# Patient Record
Sex: Female | Born: 1935 | Race: White | Hispanic: No | Smoking: Never smoker
Health system: Southern US, Community
[De-identification: ages and names within clinical notes are randomized; demographics above are authoritative.]

## PROBLEM LIST (undated history)

## (undated) DIAGNOSIS — I10 Essential (primary) hypertension: Secondary | ICD-10-CM

## (undated) DIAGNOSIS — C801 Malignant (primary) neoplasm, unspecified: Secondary | ICD-10-CM

## (undated) DIAGNOSIS — I82409 Acute embolism and thrombosis of unspecified deep veins of unspecified lower extremity: Secondary | ICD-10-CM

---

## 1998-05-20 ENCOUNTER — Other Ambulatory Visit: Admission: RE | Admit: 1998-05-20 | Discharge: 1998-05-20 | Payer: Self-pay | Admitting: Emergency Medicine

## 1999-08-10 ENCOUNTER — Encounter: Admission: RE | Admit: 1999-08-10 | Discharge: 1999-08-10 | Payer: Self-pay | Admitting: Emergency Medicine

## 1999-08-10 ENCOUNTER — Encounter: Payer: Self-pay | Admitting: Emergency Medicine

## 1999-08-14 HISTORY — PX: MASTECTOMY: SHX3

## 1999-08-16 ENCOUNTER — Encounter: Payer: Self-pay | Admitting: Emergency Medicine

## 1999-08-16 ENCOUNTER — Encounter: Admission: RE | Admit: 1999-08-16 | Discharge: 1999-08-16 | Payer: Self-pay | Admitting: Emergency Medicine

## 1999-08-22 ENCOUNTER — Encounter: Payer: Self-pay | Admitting: General Surgery

## 1999-08-22 ENCOUNTER — Encounter: Admission: RE | Admit: 1999-08-22 | Discharge: 1999-08-22 | Payer: Self-pay | Admitting: General Surgery

## 1999-08-24 ENCOUNTER — Other Ambulatory Visit: Admission: RE | Admit: 1999-08-24 | Discharge: 1999-08-24 | Payer: Self-pay | Admitting: General Surgery

## 1999-08-24 ENCOUNTER — Encounter (INDEPENDENT_AMBULATORY_CARE_PROVIDER_SITE_OTHER): Payer: Self-pay

## 1999-08-24 ENCOUNTER — Encounter: Payer: Self-pay | Admitting: General Surgery

## 1999-08-24 ENCOUNTER — Ambulatory Visit (HOSPITAL_BASED_OUTPATIENT_CLINIC_OR_DEPARTMENT_OTHER): Admission: RE | Admit: 1999-08-24 | Discharge: 1999-08-24 | Payer: Self-pay | Admitting: General Surgery

## 1999-09-08 ENCOUNTER — Encounter: Payer: Self-pay | Admitting: General Surgery

## 1999-09-08 ENCOUNTER — Encounter (INDEPENDENT_AMBULATORY_CARE_PROVIDER_SITE_OTHER): Payer: Self-pay | Admitting: *Deleted

## 1999-09-08 ENCOUNTER — Ambulatory Visit (HOSPITAL_COMMUNITY): Admission: RE | Admit: 1999-09-08 | Discharge: 1999-09-09 | Payer: Self-pay | Admitting: General Surgery

## 1999-11-09 ENCOUNTER — Other Ambulatory Visit: Admission: RE | Admit: 1999-11-09 | Discharge: 1999-11-09 | Payer: Self-pay | Admitting: Emergency Medicine

## 2000-08-19 ENCOUNTER — Encounter: Payer: Self-pay | Admitting: General Surgery

## 2000-08-19 ENCOUNTER — Encounter: Admission: RE | Admit: 2000-08-19 | Discharge: 2000-08-19 | Payer: Self-pay | Admitting: General Surgery

## 2000-11-18 ENCOUNTER — Other Ambulatory Visit: Admission: RE | Admit: 2000-11-18 | Discharge: 2000-11-18 | Payer: Self-pay | Admitting: Emergency Medicine

## 2001-08-22 ENCOUNTER — Encounter: Payer: Self-pay | Admitting: Emergency Medicine

## 2001-08-22 ENCOUNTER — Encounter: Admission: RE | Admit: 2001-08-22 | Discharge: 2001-08-22 | Payer: Self-pay | Admitting: Emergency Medicine

## 2001-09-09 ENCOUNTER — Ambulatory Visit (HOSPITAL_COMMUNITY): Admission: RE | Admit: 2001-09-09 | Discharge: 2001-09-09 | Payer: Self-pay | Admitting: Gastroenterology

## 2001-12-01 ENCOUNTER — Encounter: Payer: Self-pay | Admitting: Gastroenterology

## 2001-12-01 ENCOUNTER — Encounter: Admission: RE | Admit: 2001-12-01 | Discharge: 2001-12-01 | Payer: Self-pay | Admitting: Gastroenterology

## 2002-08-24 ENCOUNTER — Encounter: Admission: RE | Admit: 2002-08-24 | Discharge: 2002-08-24 | Payer: Self-pay | Admitting: Oncology

## 2002-08-24 ENCOUNTER — Encounter: Payer: Self-pay | Admitting: Oncology

## 2003-06-24 ENCOUNTER — Encounter: Admission: RE | Admit: 2003-06-24 | Discharge: 2003-06-24 | Payer: Self-pay | Admitting: Emergency Medicine

## 2003-07-28 ENCOUNTER — Encounter: Admission: RE | Admit: 2003-07-28 | Discharge: 2003-07-28 | Payer: Self-pay | Admitting: Emergency Medicine

## 2003-09-02 ENCOUNTER — Encounter: Admission: RE | Admit: 2003-09-02 | Discharge: 2003-09-02 | Payer: Self-pay | Admitting: Oncology

## 2004-08-17 ENCOUNTER — Ambulatory Visit (HOSPITAL_COMMUNITY): Admission: RE | Admit: 2004-08-17 | Discharge: 2004-08-17 | Payer: Self-pay | Admitting: Gastroenterology

## 2004-09-04 ENCOUNTER — Encounter: Admission: RE | Admit: 2004-09-04 | Discharge: 2004-09-04 | Payer: Self-pay | Admitting: Oncology

## 2004-09-21 ENCOUNTER — Encounter: Admission: RE | Admit: 2004-09-21 | Discharge: 2004-09-21 | Payer: Self-pay | Admitting: Gastroenterology

## 2004-10-06 ENCOUNTER — Ambulatory Visit: Payer: Self-pay | Admitting: Oncology

## 2004-12-01 ENCOUNTER — Ambulatory Visit: Payer: Self-pay | Admitting: Oncology

## 2005-01-29 ENCOUNTER — Ambulatory Visit: Payer: Self-pay | Admitting: Oncology

## 2005-08-29 ENCOUNTER — Ambulatory Visit: Payer: Self-pay | Admitting: Oncology

## 2005-09-05 ENCOUNTER — Encounter: Admission: RE | Admit: 2005-09-05 | Discharge: 2005-09-05 | Payer: Self-pay | Admitting: Oncology

## 2006-05-31 ENCOUNTER — Encounter: Admission: RE | Admit: 2006-05-31 | Discharge: 2006-05-31 | Payer: Self-pay | Admitting: Emergency Medicine

## 2006-06-29 ENCOUNTER — Emergency Department (HOSPITAL_COMMUNITY): Admission: EM | Admit: 2006-06-29 | Discharge: 2006-06-29 | Payer: Self-pay | Admitting: Emergency Medicine

## 2006-08-26 ENCOUNTER — Ambulatory Visit: Payer: Self-pay | Admitting: Oncology

## 2006-08-28 LAB — CBC WITH DIFFERENTIAL/PLATELET
BASO%: 0.4 % (ref 0.0–2.0)
Basophils Absolute: 0 10*3/uL (ref 0.0–0.1)
EOS%: 0.7 % (ref 0.0–7.0)
Eosinophils Absolute: 0.1 10*3/uL (ref 0.0–0.5)
HCT: 32.6 % — ABNORMAL LOW (ref 34.8–46.6)
HGB: 10.7 g/dL — ABNORMAL LOW (ref 11.6–15.9)
LYMPH%: 28.1 % (ref 14.0–48.0)
MCH: 27.1 pg (ref 26.0–34.0)
MCHC: 32.7 g/dL (ref 32.0–36.0)
MCV: 82.8 fL (ref 81.0–101.0)
MONO#: 1 10*3/uL — ABNORMAL HIGH (ref 0.1–0.9)
MONO%: 10.1 % (ref 0.0–13.0)
NEUT#: 5.8 10*3/uL (ref 1.5–6.5)
NEUT%: 60.7 % (ref 39.6–76.8)
Platelets: 348 10*3/uL (ref 145–400)
RBC: 3.94 10*6/uL (ref 3.70–5.32)
RDW: 15.9 % — ABNORMAL HIGH (ref 11.3–14.5)
WBC: 9.6 10*3/uL (ref 3.9–10.0)
lymph#: 2.7 10*3/uL (ref 0.9–3.3)

## 2006-08-28 LAB — COMPREHENSIVE METABOLIC PANEL
ALT: 9 U/L (ref 0–35)
AST: 15 U/L (ref 0–37)
Albumin: 3.5 g/dL (ref 3.5–5.2)
Alkaline Phosphatase: 101 U/L (ref 39–117)
BUN: 27 mg/dL — ABNORMAL HIGH (ref 6–23)
CO2: 25 mEq/L (ref 19–32)
Calcium: 8.5 mg/dL (ref 8.4–10.5)
Chloride: 106 mEq/L (ref 96–112)
Creatinine, Ser: 0.8 mg/dL (ref 0.40–1.20)
Glucose, Bld: 87 mg/dL (ref 70–99)
Potassium: 4.2 mEq/L (ref 3.5–5.3)
Sodium: 140 mEq/L (ref 135–145)
Total Bilirubin: 0.2 mg/dL — ABNORMAL LOW (ref 0.3–1.2)
Total Protein: 7.1 g/dL (ref 6.0–8.3)

## 2006-08-28 LAB — IRON AND TIBC
%SAT: 14 % — ABNORMAL LOW (ref 20–55)
Iron: 39 ug/dL — ABNORMAL LOW (ref 42–145)
TIBC: 277 ug/dL (ref 250–470)
UIBC: 238 ug/dL

## 2006-08-28 LAB — FERRITIN: Ferritin: 76 ng/mL (ref 10–291)

## 2006-08-28 LAB — MORPHOLOGY: PLT EST: ADEQUATE

## 2006-08-28 LAB — LACTATE DEHYDROGENASE: LDH: 110 U/L (ref 94–250)

## 2006-09-09 ENCOUNTER — Encounter: Admission: RE | Admit: 2006-09-09 | Discharge: 2006-09-09 | Payer: Self-pay | Admitting: Oncology

## 2007-09-12 ENCOUNTER — Encounter: Admission: RE | Admit: 2007-09-12 | Discharge: 2007-09-12 | Payer: Self-pay | Admitting: Emergency Medicine

## 2008-09-29 ENCOUNTER — Encounter: Admission: RE | Admit: 2008-09-29 | Discharge: 2008-09-29 | Payer: Self-pay | Admitting: Emergency Medicine

## 2008-11-08 IMAGING — CT CT HEAD W/O CM
1 of 2 series · 13 of 30 positions shown, 17 images · IV contrast (agent unspecified)
Comparison: none

CLINICAL DATA: Near syncope.  Hypertension.
 HEAD CT WITHOUT CONTRAST:
TECHNIQUE: Contiguous axial images were obtained from the base of the skull through the vertex according to standard protocol without contrast.

[Series 2: brain · axial · 0.47mm/px · z∈[+144,+270]mm · 13 of 28 slices shown, 17 images]
[im 2/28  brain]
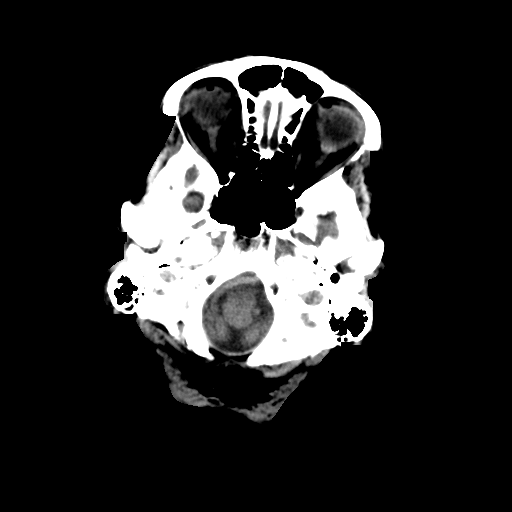
[im 2/28  bone]
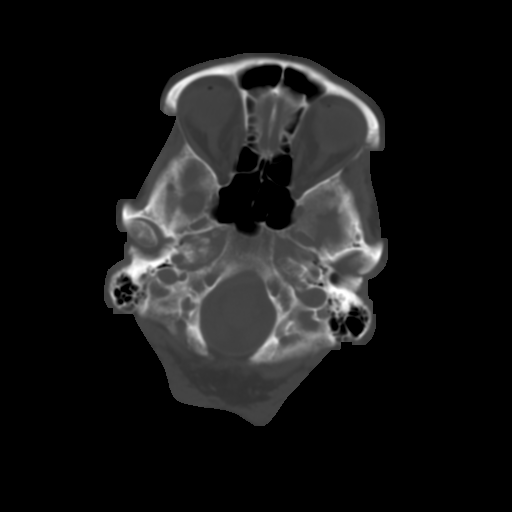
[im 4/28  brain]
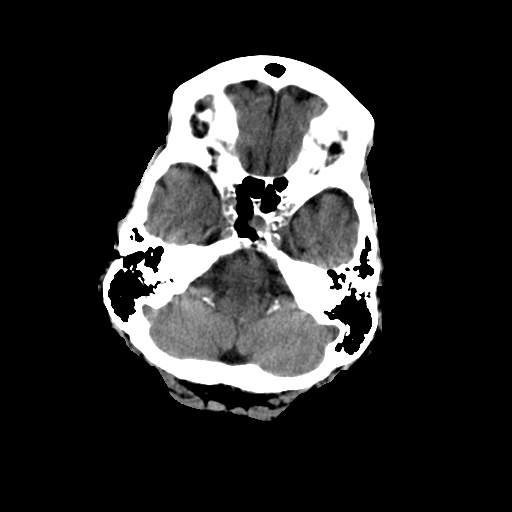
[im 6/28  brain]
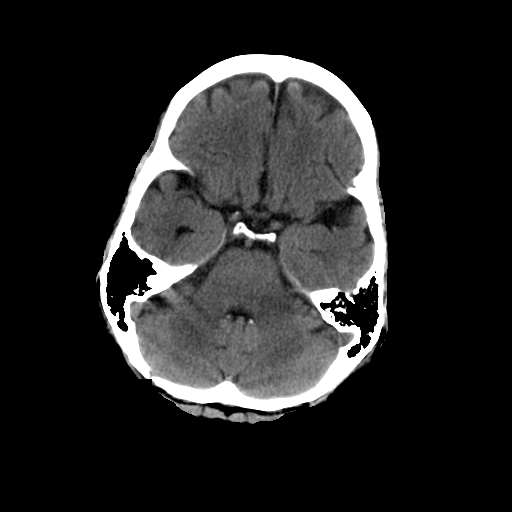
[im 8/28  brain]
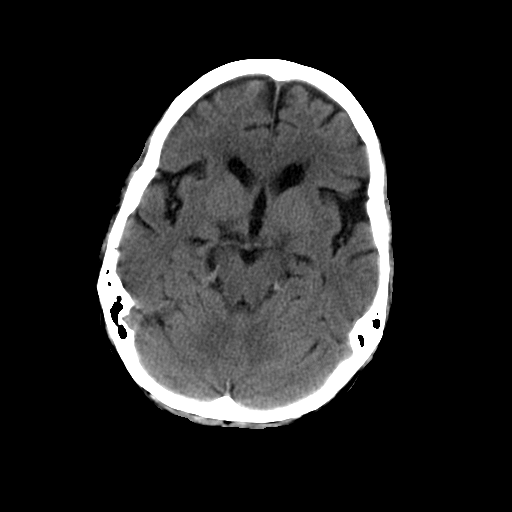
[im 10/28  brain]
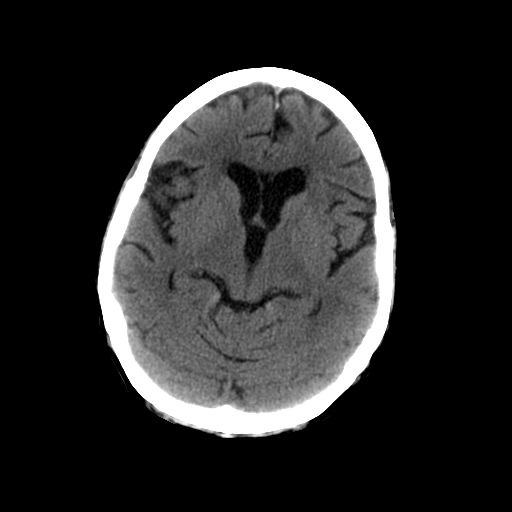
[im 10/28  bone]
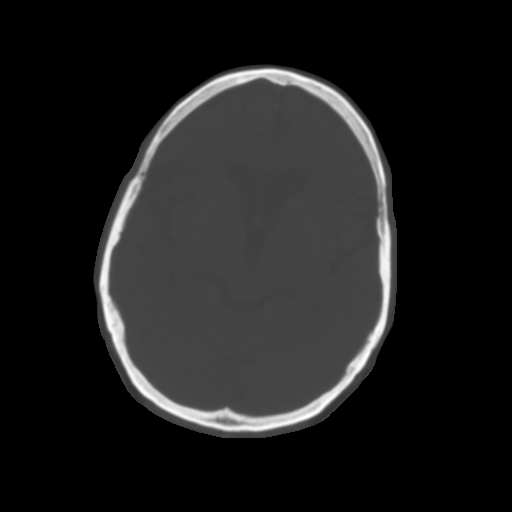
[im 12/28  brain]
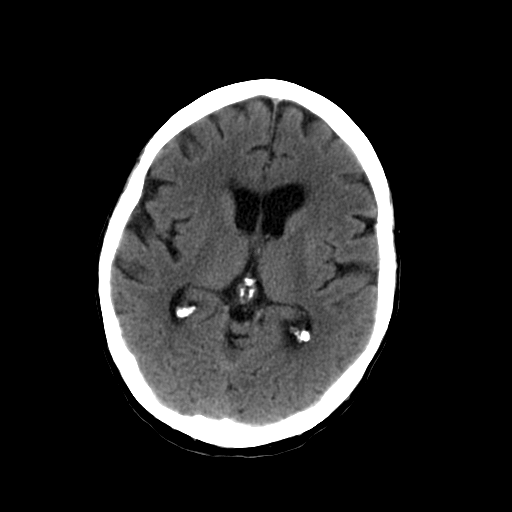
[im 14/28  brain]
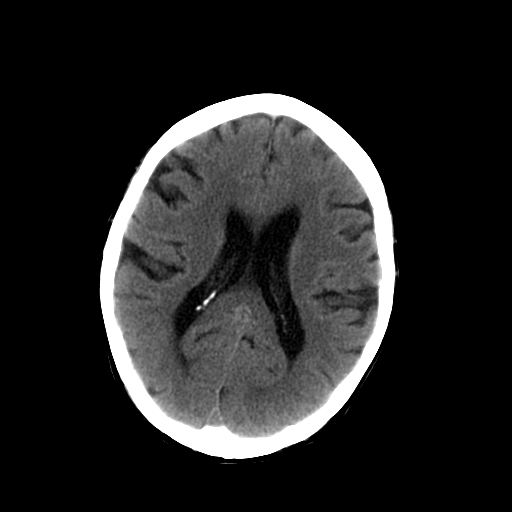
[im 16/28  brain]
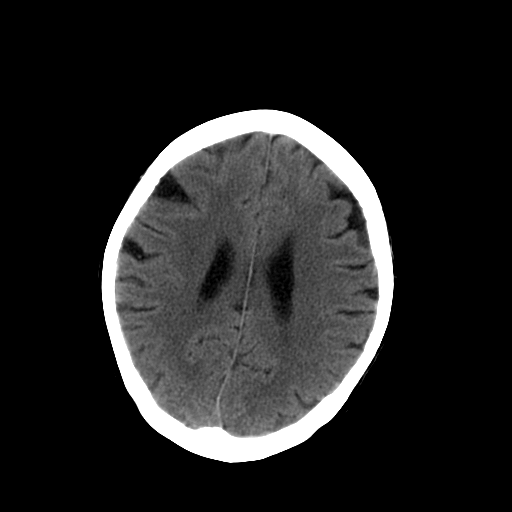
[im 18/28  brain]
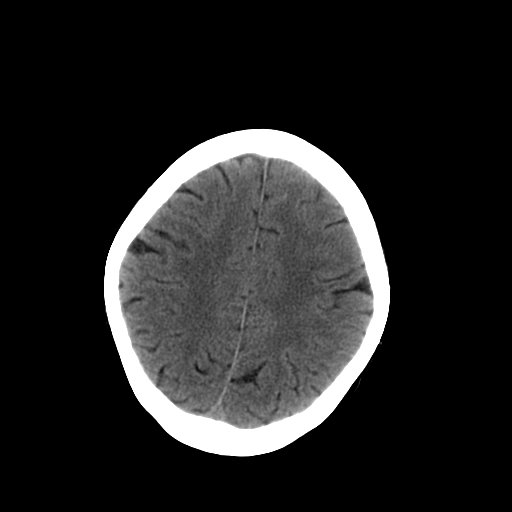
[im 18/28  bone]
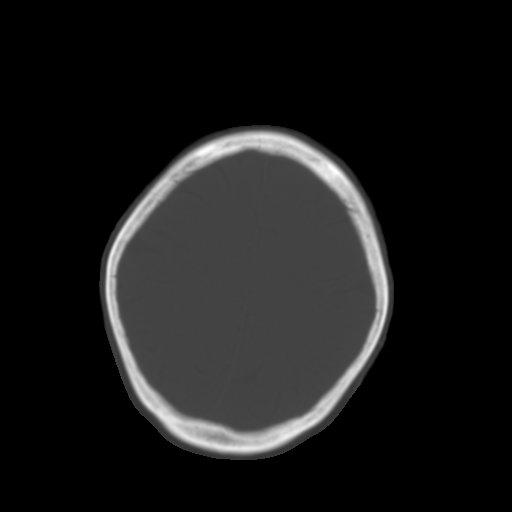
[im 20/28  brain]
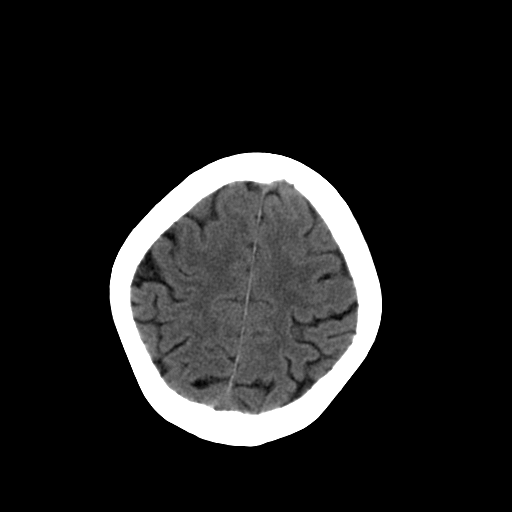
[im 22/28  brain]
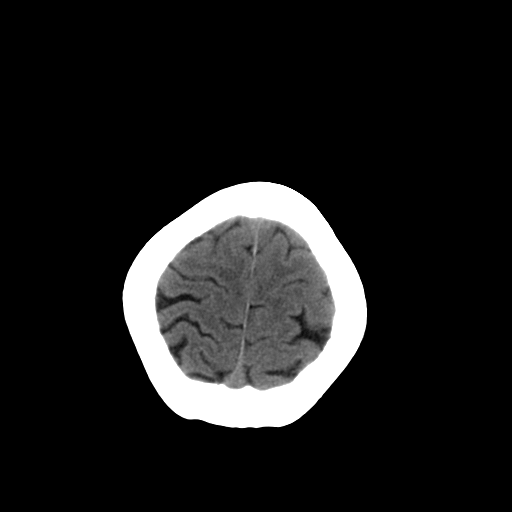
[im 24/28  brain]
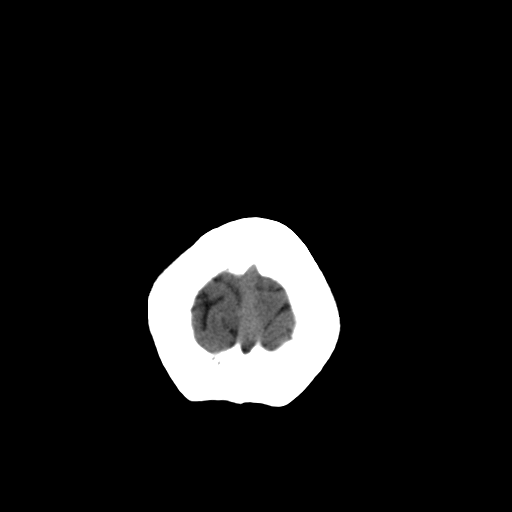
[im 26/28  brain]
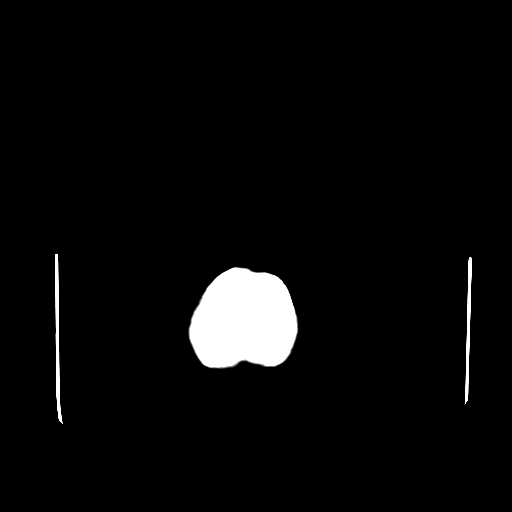
[im 26/28  bone]
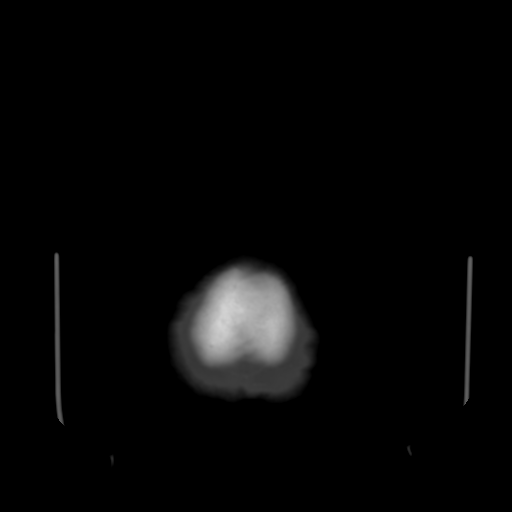

[13 of 30 positions shown; findings below may reference images not displayed]

FINDINGS: There is no evidence of intracranial hemorrhage, brain edema, acute infarct, mass lesion, or mass effect.  No other intra-axial abnormalities are seen, and the ventricles are within normal limits.  No abnormal extra-axial fluid collections or masses are identified.  No skull abnormalities are noted.
IMPRESSION: Negative non-contrast head CT.

## 2009-09-30 ENCOUNTER — Encounter: Admission: RE | Admit: 2009-09-30 | Discharge: 2009-09-30 | Payer: Self-pay | Admitting: Internal Medicine

## 2010-09-02 ENCOUNTER — Other Ambulatory Visit: Payer: Self-pay | Admitting: Internal Medicine

## 2010-09-02 DIAGNOSIS — Z1239 Encounter for other screening for malignant neoplasm of breast: Secondary | ICD-10-CM

## 2010-10-03 ENCOUNTER — Ambulatory Visit
Admission: RE | Admit: 2010-10-03 | Discharge: 2010-10-03 | Disposition: A | Discharge status: Home / Self Care | Payer: Medicare Other | Source: Ambulatory Visit | Attending: Internal Medicine | Admitting: Internal Medicine

## 2010-10-03 DIAGNOSIS — Z1239 Encounter for other screening for malignant neoplasm of breast: Secondary | ICD-10-CM

## 2010-12-29 NOTE — Procedures (Signed)
[place]. [facility]  Patient:    Lacey Morgan, Lacey Morgan Visit Number: [id number] MRN: [id number]          Service Type: END Location: ENDO Attending Physician:  Charna Elizabeth Dictated by:   Anselmo Rod, M.D. Proc. Date: 09/10/01 Admit Date:  09/09/2001   CC:         Reuben Likes, M.D.  Adolph Pollack, M.D.   Procedure Report  DATE OF BIRTH:  1924/02/10.  PROCEDURE:  Colonoscopy.  ENDOSCOPIST:  Anselmo Rod, M.D.  INSTRUMENT USED:  Adjustable pediatric colonoscope.  INDICATION FOR PROCEDURE:  Screening colonoscopy being performed in a 77 year old white female with a personal history of breast cancer and a history of blood in stools.  The patient also has a history of anemia.  PREPROCEDURE PREPARATION:  Informed consent was procured from the patient. The patient was fasted for eight hours prior to the procedure and prepped with a bottle of magnesium citrate and a gallon of NuLytely the night prior to the procedure.  PREPROCEDURE PHYSICAL:  VITAL SIGNS:  The patient had stable vital signs.  NECK:  Supple.  CHEST:  Clear to auscultation.  S1, S2 regular.  ABDOMEN:  Soft with normal bowel sounds.  DESCRIPTION OF PROCEDURE:  The patient was placed in the left lateral decubitus position and sedated with 50 mg of Demerol and 5 mg of Versed intravenously.  Once the patient was adequately sedate and maintained on low-flow oxygen and continuous cardiac monitoring, the Olympus video colonoscope was advanced from the rectum to the cecum without difficulty. The patient had external hemorrhoids and prolapsing large internal hemorrhoids seen on anal inspection.  The rest of the colon up to the cecum appeared healthy and without lesions.  There was no evidence of diverticulosis, masses, or polyps.  IMPRESSION: 1. Large, prolapsing internal hemorrhoids. 2. Small external hemorrhoids. 3. Normal colon up to the cecum.  No masses or polyps  seen.  RECOMMENDATIONS: 1. Repeat colorectal cancer screening is recommended in the next five years. 2. A surgical evaluation has been set up for a formal hemorrhoidectomy with    Dr. Avel Peace in the next couple of weeks. Dictated by:   Anselmo Rod, M.D. Attending Physician:  Charna Elizabeth DD:  09/10/01 TD:  09/10/01 Job: [id number]

## 2010-12-29 NOTE — Op Note (Signed)
[place]. [facility]  Patient:    Lacey Morgan                     MRN: [id number] Proc. Date: 09/08/99 Adm. Date:  [id number] Attending:  Cherylynn Ridges                           Operative Report  PREOPERATIVE DIAGNOSIS: Right breast cancer.  POSTOPERATIVE DIAGNOSIS: Right breast cancer.  PROCEDURE: 1. Right ______ with central node biopsy. 2. Lymphatic mapping with vital blue.  SURGEON: Jimmye Norman, M.D.  ASSISTANT: Donnie Coffin. Samuella Cota, M.D.  ANESTHESIA: General endotracheal.  ESTIMATED BLOOD LOSS: Less than 100 cc.  COMPLICATIONS: None.  CONDITION ON DISCHARGE: Stable.  SPECIMEN: Right breast with right axillary central node from the first level.  FINDINGS: No entrance into the biopsy cavity.  Localized lymph node by vital blue and radioactivity.  INDICATIONS: The patient is a 77 year old woman who developed an interval change on a mammogram.  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  She is now brought to the operating room for a right mastectomy nd a central node biopsy.  DESCRIPTION OF PROCEDURE: The patient was taken to the operating room and placed on the table in supine position.  A towel roll was placed underneath the right shoulder.  She was prepped and draped in the usual sterile manner after adequate endotracheal intubation.  A #10 blade was used to make the superior flap just about 1 cm above the right areolar edge and the biopsy site.  It was taken down anterior and medial to the  anterior axillary line.  The superior breast flap was made using breast hooks or mastectomy hooks and electrocautery.  It was taken down to the chest wall just beneath the right clavicle, medially, just medial to the sternum.  An inferior flap was made in a similar manner initially making a skin incision ith a #10 blade and then  dissecting out a thin flap using electrocautery down to the chest wall just inferior to the pectoralis major muscle.  Subsequently, after we had detached the breast medially and laterally and down to the latissimus dorsi  anteriorly we were able to localize the central lymph nodes using a neoprobe #2. It was easily identified and then subsequently it was also stained with vital blue which had been injected at the beginning of the case prior to prepping.  Once we removed the central node and had high radioactivity counts and also was blue, we sent it for a frozen section.  It demonstrated no evidence of tumor on touch prep. Subsequently, once the lymph node was removed with Hemoclips being applied to the lymphatics we irrigated the site of the mastectomy and axilla with sterile water. We controlled bleeding with electrocautery.  We put two #10 flat Jackson-Pratt drains, one in the axilla and underneath the superior flap.  Once we had confirmation that the sentinel was negative, we closed with only the central node being removed and the breast specimen.  We reapproximated the skin flaps using interrupted 3-0 Vicryl subcuticular stitches and then the skin was closed using a running subcuticular 4-0 Vicryl.  All needle counts, sponge counts and instrument counts were correct.  Steri-Strips were applied to the wound along with a sterile dressing. DD:  09/08/99 TD:  09/09/99 Job: [id number]

## 2010-12-29 NOTE — H&P (Signed)
[place]. [facility]  Patient:    Lacey Morgan, Lacey Morgan                      MRN: [id number] Adm. Date:  08/24/99 Dictator:   Jimmye Norman, M.D. CC:         Reuben Likes, M.D.                         History and Physical  PREOPERATIVE DIAGNOSIS:  Mammographic lesion of the right breast.  IDENTIFICATION/CHIEF COMPLAINT:  The patient is a 77 year old woman, otherwise healthy, with a recent noted change on her mammogram, with now a suspicious lesion in the retro nipple or areolar area of the right breast.  HISTORY OF PRESENT ILLNESS:  I first saw Lacey Morgan in my office on August 22, 1999, at which time she was referred because of a change in her mammogram from December of 1999 to December of 2000.  Irregularities were actually noted on both breast but when she went back in for diagnostic and mag views the one on the left appeared to be a simple cyst while the one on the right continued to have features suggesting malignancy.  The patient is gravida 7, para 6, with one miscarriage.  She has had no cesarean sections and all the births were normal spontaneous vaginal deliveries.  She takes no hormones and never has taken any hormonal therapy.  Her only medical problem is mild hypertension, for which she takes 25 mg of hydrochlorothiazide.  FAMILY HISTORY:  She has no family history of breast cancer.  She does have a daughter who has recently been worked up for a breast mass, but at the current ime no pathology is known.  She started her periods at the age of 76 and went into menopause sometime in her 37s; she cannot be specific as to the age.  Her other  family history is significant for a father who died of heart disease and mother who died of some unknown kind of cancer.  She has three brothers who are living that have high blood pressure and cardiac disease, and two sisters who also have high blood pressure and cardiac disease.  PAST MEDICAL  HISTORY:  Unremarkable except for the hypertension.  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  ALLERGIES:  No known drug allergies.  MEDICATIONS:  She takes only hydrochlorothiazide.  REVIEW OF SYSTEMS:  She has had no nipple changes, discharges, or pain.  She wears glasses.  She has normal hearing.  Wears dentures.  High blood pressure only. he has no pulmonary problems.  She has had problems with hemorrhoids in the past but it currently is inactive.  She has some urinary incontinence by history.  Her last mammogram was in December 2000, which showed the lesion mentioned previously. he has no skin changes.  She does have some back pain and neurologically she is intact.  PHYSICAL EXAMINATION:  GENERAL:  She is well-developed, well-nourished and in no acute distress.  VITAL SIGNS:  Pulse 64, blood pressure 130/70.  She was afebrile.  Respirations  were 16.  HEENT:  Normocephalic, atraumatic.  NECK:  No cervical adenopathy.  No bruits.  CHEST:  Clear.  BREAST:  She has large pendulous breast bilaterally.  She has nipple inversion n the left side, which the patient says is chronic and it has been that way all her life.  She has  no excoriation or rashes on her nipple.  Axillary examination demonstrates no adenopathy bilaterally.  Breast palpable examination:  She has o dominant masses although she does have fibrocystic nodularity throughout both breast with some question of an inferior medial nodularity or palpable lesion on the right side.  There was no expression of any discharge from either nipple.  ABDOMEN/PELVIC/RECTAL:  Deferred.  LABORATORY DATA:  Laboratory studies are pending.  The only radiologic examinations we have currently are the mammograms which have been discussed.  IMPRESSION/PLAN: 1.  New mammographic spiculated lesion on recent mammogram suspicious for cancer.     However, it is not palpable and therefore she  will require wire localization     biopsy.  This will be done as an outpatient at [facility] day surgery as soon as     possible.  She will get some perioperative antibiotic coverage. DD:  08/24/99 TD:  08/24/99 Job: [id number]

## 2010-12-29 NOTE — Op Note (Signed)
[place]. [facility]  Patient:    Lacey Morgan                     MRN: [id number] Proc. Date: 08/24/99 Adm. Date:  [id number] Attending:  Cherylynn Ridges                           Operative Report  PREOPERATIVE DIAGNOSIS:  Abnormal mammogram and suspicious mammographic lesion f the right breast.  POSTOPERATIVE DIAGNOSIS:  Abnormal mammogram and suspicious mammographic lesion of the right breast.  OPERATION PERFORMED:  Wire localization right breast biopsy.  SURGEON:  Jimmye Norman, M.D.  ASSISTANT:  None.  ANESTHESIA:  General with a laryngeal airway and 0.5% Marcaine given postoperatively.  ESTIMATED BLOOD LOSS:  Less than 20 cc.  COMPLICATIONS:  None.  CONDITION:  Stable.  SPECIMENS:  Wire localization biopsy confirmed to have mammographic lesion.  FINDINGS:  No palpable abnormalities, no gross tumor.  DESCRIPTION OF PROCEDURE:  The patient was taken to the operating room and placed on the table in supine position.  After adequate general laryngeal airway anesthetic was administered, she was prepped and draped in the usual sterile manner exposing the right breast.  The patient had had a previous wire localization by radiology and the wire was ut short approximately 2 cm from the skin entrance site.  It coursed medially and somewhat inferiorly towards the nipple from the medial aspect of the right breast. A curvilinear periareolar incision was made after the patient was put asleep and prepped and draped in the usual sterile manner.  We dissected down into the subcutaneous with a #15 blade down to the wire to the tip of the wire, grasping it with an Allis clamp and then dissecting out around the tip of the wire.  We removed the specimen in toto with the wire being brought through its entrance at the skin. We confirmed that it had the mammographic lesion using specimen radiography and  then subsequently we irrigated, found there to  be adequate hemostasis with electrocautery, then closed in two layers with a subcu layer of 4-0 Vicryl and  skin layer of running 5-0 Vicryl.  All sponge, needle and instrument counts were correct.  A sterile dressing was applied to the wound. DD:  08/24/99 TD:  08/24/99 Job: [id number]

## 2011-09-03 ENCOUNTER — Other Ambulatory Visit: Payer: Self-pay | Admitting: Internal Medicine

## 2011-09-03 DIAGNOSIS — Z1231 Encounter for screening mammogram for malignant neoplasm of breast: Secondary | ICD-10-CM

## 2011-10-05 ENCOUNTER — Ambulatory Visit
Admission: RE | Admit: 2011-10-05 | Discharge: 2011-10-05 | Disposition: A | Discharge status: Home / Self Care | Payer: No Typology Code available for payment source | Source: Ambulatory Visit | Attending: Internal Medicine | Admitting: Internal Medicine

## 2011-10-05 DIAGNOSIS — Z1231 Encounter for screening mammogram for malignant neoplasm of breast: Secondary | ICD-10-CM

## 2012-09-15 ENCOUNTER — Other Ambulatory Visit: Payer: Self-pay | Admitting: Internal Medicine

## 2012-09-15 DIAGNOSIS — Z1231 Encounter for screening mammogram for malignant neoplasm of breast: Secondary | ICD-10-CM

## 2012-10-21 ENCOUNTER — Ambulatory Visit
Admission: RE | Admit: 2012-10-21 | Discharge: 2012-10-21 | Disposition: A | Discharge status: Home / Self Care | Payer: Medicare Other | Source: Ambulatory Visit | Attending: Internal Medicine | Admitting: Internal Medicine

## 2013-10-05 ENCOUNTER — Other Ambulatory Visit: Payer: Self-pay | Admitting: Internal Medicine

## 2013-10-05 ENCOUNTER — Other Ambulatory Visit: Payer: Self-pay

## 2013-10-05 DIAGNOSIS — Z1231 Encounter for screening mammogram for malignant neoplasm of breast: Secondary | ICD-10-CM

## 2013-10-23 ENCOUNTER — Ambulatory Visit
Admission: RE | Admit: 2013-10-23 | Discharge: 2013-10-23 | Disposition: A | Discharge status: Home / Self Care | Payer: Medicare Other | Source: Ambulatory Visit

## 2013-10-23 DIAGNOSIS — Z1231 Encounter for screening mammogram for malignant neoplasm of breast: Secondary | ICD-10-CM

## 2014-09-28 ENCOUNTER — Other Ambulatory Visit: Payer: Self-pay

## 2014-09-28 DIAGNOSIS — Z1231 Encounter for screening mammogram for malignant neoplasm of breast: Secondary | ICD-10-CM

## 2014-10-25 ENCOUNTER — Ambulatory Visit
Admission: RE | Admit: 2014-10-25 | Discharge: 2014-10-25 | Disposition: A | Discharge status: Home / Self Care | Payer: Medicare Other | Source: Ambulatory Visit

## 2014-10-25 DIAGNOSIS — Z1231 Encounter for screening mammogram for malignant neoplasm of breast: Secondary | ICD-10-CM

## 2015-06-04 ENCOUNTER — Encounter (HOSPITAL_COMMUNITY): Payer: Self-pay | Admitting: Nurse Practitioner

## 2015-06-04 ENCOUNTER — Emergency Department (HOSPITAL_COMMUNITY)
Admission: EM | Admit: 2015-06-04 | Discharge: 2015-06-04 | Disposition: A | Discharge status: Home / Self Care | Payer: Medicare Other | Attending: Emergency Medicine | Admitting: Emergency Medicine

## 2015-06-04 DIAGNOSIS — I10 Essential (primary) hypertension: Secondary | ICD-10-CM | POA: Insufficient documentation

## 2015-06-04 DIAGNOSIS — Z859 Personal history of malignant neoplasm, unspecified: Secondary | ICD-10-CM | POA: Diagnosis not present

## 2015-06-04 DIAGNOSIS — R600 Localized edema: Secondary | ICD-10-CM | POA: Diagnosis not present

## 2015-06-04 DIAGNOSIS — R5383 Other fatigue: Secondary | ICD-10-CM | POA: Diagnosis not present

## 2015-06-04 DIAGNOSIS — R630 Anorexia: Secondary | ICD-10-CM | POA: Diagnosis not present

## 2015-06-04 DIAGNOSIS — R2240 Localized swelling, mass and lump, unspecified lower limb: Secondary | ICD-10-CM | POA: Diagnosis present

## 2015-06-04 HISTORY — DX: Malignant (primary) neoplasm, unspecified: C80.1

## 2015-06-04 HISTORY — DX: Essential (primary) hypertension: I10

## 2015-06-04 LAB — BRAIN NATRIURETIC PEPTIDE: B Natriuretic Peptide: 210 pg/mL — ABNORMAL HIGH (ref 0.0–100.0)

## 2015-06-04 LAB — CBC
HCT: 34.4 % — ABNORMAL LOW (ref 36.0–46.0)
HEMOGLOBIN: 11.3 g/dL — AB (ref 12.0–15.0)
MCH: 30.3 pg (ref 26.0–34.0)
MCHC: 32.8 g/dL (ref 30.0–36.0)
MCV: 92.2 fL (ref 78.0–100.0)
Platelets: 177 10*3/uL (ref 150–400)
RBC: 3.73 MIL/uL — ABNORMAL LOW (ref 3.87–5.11)
RDW: 18.2 % — AB (ref 11.5–15.5)
WBC: 10 10*3/uL (ref 4.0–10.5)

## 2015-06-04 LAB — BASIC METABOLIC PANEL
Anion gap: 5 (ref 5–15)
BUN: 32 mg/dL — ABNORMAL HIGH (ref 6–20)
CO2: 24 mmol/L (ref 22–32)
Calcium: 8.2 mg/dL — ABNORMAL LOW (ref 8.9–10.3)
Chloride: 110 mmol/L (ref 101–111)
Creatinine, Ser: 1.01 mg/dL — ABNORMAL HIGH (ref 0.44–1.00)
GFR calc Af Amer: 55 mL/min — ABNORMAL LOW (ref >60)
GFR, EST NON AFRICAN AMERICAN: 48 mL/min — AB (ref >60)
Glucose, Bld: 86 mg/dL (ref 65–99)
POTASSIUM: 4.9 mmol/L (ref 3.5–5.1)
SODIUM: 139 mmol/L (ref 135–145)

## 2015-06-04 MED ORDER — FUROSEMIDE 20 MG PO TABS: 20.0000 mg | ORAL_TABLET | Freq: Every day | ORAL | Status: DC

## 2015-06-04 MED ORDER — FUROSEMIDE 10 MG/ML IJ SOLN
40.0000 mg | Freq: Once | INTRAMUSCULAR | Status: AC
  Administered 2015-06-04: 40 mg via INTRAVENOUS
  Filled 2015-06-04: qty 4

## 2015-06-04 NOTE — Discharge Instructions (Signed)
I am giving you a fluid pill called Lasix to try and get the edema off your legs. Please take it once a day in the morning.   Please call your PCP on Monday to have a follow up appointment in the next several days.   Edema Edema is an abnormal buildup of fluids. It is more common in your legs and thighs. Painless swelling of the feet and ankles is more likely as a person ages. It also is common in looser skin, like around your eyes. HOME CARE   Keep the affected body part above the level of the heart while lying down.  Do not sit still or stand for a long time.  Do not put anything right under your knees when you lie down.  Do not wear tight clothes on your upper legs.  Exercise your legs to help the puffiness (swelling) go down.  Wear elastic bandages or support stockings as told by your doctor.  A low-salt diet may help lessen the puffiness.  Only take medicine as told by your doctor. GET HELP IF:  Treatment is not working.  You have heart, liver, or kidney disease and notice that your skin looks puffy or shiny.  You have puffiness in your legs that does not get better when you raise your legs.  You have sudden weight gain for no reason. GET HELP RIGHT AWAY IF:   You have shortness of breath or chest pain.  You cannot breathe when you lie down.  You have pain, redness, or warmth in the areas that are puffy.  You have heart, liver, or kidney disease and get edema all of a sudden.  You have a fever and your symptoms get worse all of a sudden. MAKE SURE YOU:   Understand these instructions.  Will watch your condition.  Will get help right away if you are not doing well or get worse.   This information is not intended to replace advice given to you by your health care provider. Make sure you discuss any questions you have with your health care provider.   Document Released: 01/16/2008 Document Revised: 08/04/2013 Document Reviewed: 05/22/2013 Elsevier Interactive  Patient Education [employer].

## 2015-06-04 NOTE — ED Notes (Signed)
Patient left at this time with all belongings. 

## 2015-06-04 NOTE — ED Notes (Signed)
Pt reports 2-3 day history of BLE pain and swelling, noticed fluid weeping from her legs since yesterday. BLE with pitting edema nad oozing fluid, RLE erythematous. She denies sob, cp. Has been having trouble walking due to the pain

## 2015-06-04 NOTE — ED Provider Notes (Signed)
CSN: [id number]     Arrival date & time 06/04/15  1603 History   First MD Initiated Contact with Patient 06/04/15 1627     Chief Complaint  Patient presents with  . Leg Swelling   (Consider location/radiation/quality/duration/timing/severity/associated sxs/prior Treatment) HPI Comments: Lacey Morgan is an 81 year old female with a past medical history of hypertension who presents with 3 day history of bilateral leg swelling and weeping. She reports that she never has any leg swelling but her daughters at bedside report that they often notice feet and ankle swelling when the visit her but never any leg swelling. She denies any history of heart failure but is currently on metoprolol and lisinopril but denies any diuretics. She reports that the right leg has been red for the past 3 days. Denies any fevers, chills, trauma, shortness of breath, chest pain. Daughters report that she has been complaining of decreased energy and feeling cold over the past 3 days. Decreased appetite during this span as well.   The history is provided by the patient and a relative.    Past Medical History  Diagnosis Date  . Hypertension   . Cancer [facility])    Past Surgical History  Procedure Laterality Date  . Mastectomy     History reviewed. No pertinent family history. Social History  Substance Use Topics  . Smoking status: Never Smoker   . Smokeless tobacco: None  . Alcohol Use: No   OB History    No data available     Review of Systems  Constitutional: Positive for appetite change and fatigue. Negative for fever and chills.  HENT: Negative for facial swelling.   Respiratory: Negative for cough, chest tightness, shortness of breath and wheezing.   Cardiovascular: Positive for leg swelling. Negative for chest pain and palpitations.  Gastrointestinal: Negative for nausea, vomiting, abdominal pain, diarrhea, constipation and abdominal distention.  Endocrine: Positive for cold intolerance.  Genitourinary:  Negative for dysuria and difficulty urinating.  Neurological: Negative for dizziness, light-headedness and headaches.    Allergies  Review of patient's allergies indicates no known allergies.  Home Medications   Prior to Admission medications   Not on File   BP 116/54 mmHg  Pulse 64  Temp(Src) 98.3 F (36.8 C) (Oral)  Resp 20  Ht 5' (1.524 m)  Wt 109 lb (49.442 kg)  BMI 21.29 kg/m2  SpO2 100%   Physical Exam  Constitutional: She is oriented to person, place, and time. Vital signs are normal. She is cooperative. No distress.  HENT:  Head: Normocephalic and atraumatic.  Mouth/Throat: Oropharynx is clear and moist. No oropharyngeal exudate.  Eyes: Conjunctivae and EOM are normal. Pupils are equal, round, and reactive to light.  Neck: Normal range of motion. Neck supple. No JVD present.  Cardiovascular: Normal rate, regular rhythm, normal heart sounds and intact distal pulses.  Exam reveals no gallop and no friction rub.   No murmur heard. Pulmonary/Chest: Effort normal and breath sounds normal. No respiratory distress. She has no decreased breath sounds. She has no wheezes. She has no rhonchi. She has no rales.  Abdominal: Soft. Bowel sounds are normal. She exhibits no distension. There is no tenderness.  Musculoskeletal: Normal range of motion.  Neurological: She is alert and oriented to person, place, and time. No cranial nerve deficit.  Skin: No lesion and no rash noted. She is not diaphoretic. There is erythema.  Bilateral 3+ pitting edema to knees with weeping. Both feet are cold to touch. Right leg with mild  erythema but no open wounds or warmth.   Psychiatric: She has a normal mood and affect.    ED Course  Procedures (including critical care time) Labs Review Labs Reviewed  BASIC METABOLIC PANEL - Abnormal; Notable for the following:    BUN 32 (*)    Creatinine, Ser 1.01 (*)    Calcium 8.2 (*)    GFR calc non Af Amer 48 (*)    GFR calc Af Amer 55 (*)    All  other components within normal limits  CBC - Abnormal; Notable for the following:    RBC 3.73 (*)    Hemoglobin 11.3 (*)    HCT 34.4 (*)    RDW 18.2 (*)    All other components within normal limits  BRAIN NATRIURETIC PEPTIDE - Abnormal; Notable for the following:    B Natriuretic Peptide 210.0 (*)    All other components within normal limits    Imaging Review No results found. I have personally reviewed and evaluated these images and lab results as part of my medical decision-making.   EKG Interpretation   Date/Time:  Saturday June 04 2015 16:20:23 EDT Ventricular Rate:  66 PR Interval:  144 QRS Duration: 70 QT Interval:  380 QTC Calculation: 398 R Axis:   49 Text Interpretation:  Normal sinus rhythm Normal ECG Confirmed by BEATON   MD, ROBERT ([id number]) on 06/04/2015 6:04:36 PM      MDM   Final diagnoses:  Bilateral leg edema   Ms. Mennella presents with 3 day history of leg edema with weeping. She has marked 3+ pitting edema in both legs up to her knees. Feet are cold to touch. No shortness of breath or chest pain/palpitations. Lung are clear on exam. She denies any history of CHF but I suspect she has some degree of chronic heart failure. Right leg does have mild erythema on her shin but she has no open wounds or warmth present. Do no suspect any cellulitis or infectious process at this time and WBC is only 10.0.   BUN is 32 and Cr 1.01 which is likely elevated for her as she is a frail elderly woman with little muscle mass. GFR is 48. Previous Cr 8 years ago was 0.8. I suspect this is all due to volume overload 2/2 CHF (either undiagnosed or more likely patient is unaware). Will treat with lasix 40 mg IV x 1 dose.   BNP elevated at 210.  Will give her a prescription for Lasix 20 mg PO daily with close follow up with her PCP  Lacey Pile, MD 06/04/15 1859  Lacey Schwartz, MD 06/04/15 1911

## 2015-06-20 ENCOUNTER — Other Ambulatory Visit: Payer: Self-pay | Admitting: Internal Medicine

## 2015-06-20 DIAGNOSIS — R609 Edema, unspecified: Secondary | ICD-10-CM

## 2015-06-21 ENCOUNTER — Ambulatory Visit
Admission: RE | Admit: 2015-06-21 | Discharge: 2015-06-21 | Disposition: A | Discharge status: Home / Self Care | Payer: Medicare Other | Source: Ambulatory Visit | Attending: Internal Medicine | Admitting: Internal Medicine

## 2015-06-21 DIAGNOSIS — R609 Edema, unspecified: Secondary | ICD-10-CM

## 2015-07-29 ENCOUNTER — Emergency Department (HOSPITAL_COMMUNITY): Payer: Medicare Other

## 2015-07-29 ENCOUNTER — Encounter (HOSPITAL_COMMUNITY): Payer: Self-pay | Admitting: Family Medicine

## 2015-07-29 ENCOUNTER — Inpatient Hospital Stay (HOSPITAL_COMMUNITY)
Admission: EM | Admit: 2015-07-29 | Discharge: 2015-08-03 | DRG: 377 | Disposition: A | Discharge status: Hospice | Payer: Medicare Other | Attending: Internal Medicine | Admitting: Internal Medicine

## 2015-07-29 ENCOUNTER — Inpatient Hospital Stay (HOSPITAL_COMMUNITY): Payer: Medicare Other

## 2015-07-29 DIAGNOSIS — K319 Disease of stomach and duodenum, unspecified: Secondary | ICD-10-CM | POA: Diagnosis present

## 2015-07-29 DIAGNOSIS — R0609 Other forms of dyspnea: Secondary | ICD-10-CM

## 2015-07-29 DIAGNOSIS — T45515A Adverse effect of anticoagulants, initial encounter: Secondary | ICD-10-CM | POA: Diagnosis present

## 2015-07-29 DIAGNOSIS — R0600 Dyspnea, unspecified: Secondary | ICD-10-CM | POA: Diagnosis not present

## 2015-07-29 DIAGNOSIS — Z515 Encounter for palliative care: Secondary | ICD-10-CM | POA: Insufficient documentation

## 2015-07-29 DIAGNOSIS — E162 Hypoglycemia, unspecified: Secondary | ICD-10-CM | POA: Diagnosis not present

## 2015-07-29 DIAGNOSIS — I82409 Acute embolism and thrombosis of unspecified deep veins of unspecified lower extremity: Secondary | ICD-10-CM

## 2015-07-29 DIAGNOSIS — R600 Localized edema: Secondary | ICD-10-CM | POA: Diagnosis present

## 2015-07-29 DIAGNOSIS — E872 Acidosis: Secondary | ICD-10-CM | POA: Diagnosis present

## 2015-07-29 DIAGNOSIS — R578 Other shock: Secondary | ICD-10-CM | POA: Diagnosis present

## 2015-07-29 DIAGNOSIS — K921 Melena: Principal | ICD-10-CM | POA: Insufficient documentation

## 2015-07-29 DIAGNOSIS — Z66 Do not resuscitate: Secondary | ICD-10-CM | POA: Diagnosis present

## 2015-07-29 DIAGNOSIS — I82401 Acute embolism and thrombosis of unspecified deep veins of right lower extremity: Secondary | ICD-10-CM | POA: Diagnosis present

## 2015-07-29 DIAGNOSIS — F0390 Unspecified dementia without behavioral disturbance: Secondary | ICD-10-CM | POA: Diagnosis present

## 2015-07-29 DIAGNOSIS — K922 Gastrointestinal hemorrhage, unspecified: Secondary | ICD-10-CM | POA: Diagnosis not present

## 2015-07-29 DIAGNOSIS — I959 Hypotension, unspecified: Secondary | ICD-10-CM | POA: Diagnosis present

## 2015-07-29 DIAGNOSIS — K253 Acute gastric ulcer without hemorrhage or perforation: Secondary | ICD-10-CM | POA: Insufficient documentation

## 2015-07-29 DIAGNOSIS — Z7189 Other specified counseling: Secondary | ICD-10-CM | POA: Insufficient documentation

## 2015-07-29 DIAGNOSIS — N179 Acute kidney failure, unspecified: Secondary | ICD-10-CM

## 2015-07-29 DIAGNOSIS — I9589 Other hypotension: Secondary | ICD-10-CM

## 2015-07-29 DIAGNOSIS — Z9010 Acquired absence of unspecified breast and nipple: Secondary | ICD-10-CM

## 2015-07-29 DIAGNOSIS — R262 Difficulty in walking, not elsewhere classified: Secondary | ICD-10-CM | POA: Diagnosis present

## 2015-07-29 DIAGNOSIS — D62 Acute posthemorrhagic anemia: Secondary | ICD-10-CM | POA: Diagnosis present

## 2015-07-29 DIAGNOSIS — K5710 Diverticulosis of small intestine without perforation or abscess without bleeding: Secondary | ICD-10-CM | POA: Diagnosis present

## 2015-07-29 DIAGNOSIS — K449 Diaphragmatic hernia without obstruction or gangrene: Secondary | ICD-10-CM | POA: Insufficient documentation

## 2015-07-29 DIAGNOSIS — E8809 Other disorders of plasma-protein metabolism, not elsewhere classified: Secondary | ICD-10-CM | POA: Diagnosis present

## 2015-07-29 DIAGNOSIS — Z853 Personal history of malignant neoplasm of breast: Secondary | ICD-10-CM | POA: Diagnosis not present

## 2015-07-29 DIAGNOSIS — R195 Other fecal abnormalities: Secondary | ICD-10-CM

## 2015-07-29 DIAGNOSIS — R579 Shock, unspecified: Secondary | ICD-10-CM | POA: Diagnosis not present

## 2015-07-29 DIAGNOSIS — R531 Weakness: Secondary | ICD-10-CM

## 2015-07-29 DIAGNOSIS — I10 Essential (primary) hypertension: Secondary | ICD-10-CM | POA: Diagnosis present

## 2015-07-29 DIAGNOSIS — Z7901 Long term (current) use of anticoagulants: Secondary | ICD-10-CM

## 2015-07-29 HISTORY — DX: Acute embolism and thrombosis of unspecified deep veins of unspecified lower extremity: I82.409

## 2015-07-29 LAB — COMPREHENSIVE METABOLIC PANEL WITH GFR
ALT: 32 U/L (ref 14–54)
AST: 36 U/L (ref 15–41)
Albumin: 1.4 g/dL — ABNORMAL LOW (ref 3.5–5.0)
Alkaline Phosphatase: 101 U/L (ref 38–126)
Anion gap: 5 (ref 5–15)
BUN: 37 mg/dL — ABNORMAL HIGH (ref 6–20)
CO2: 22 mmol/L (ref 22–32)
Calcium: 7.9 mg/dL — ABNORMAL LOW (ref 8.9–10.3)
Chloride: 107 mmol/L (ref 101–111)
Creatinine, Ser: 1.26 mg/dL — ABNORMAL HIGH (ref 0.44–1.00)
GFR calc Af Amer: 42 mL/min — ABNORMAL LOW
GFR calc non Af Amer: 37 mL/min — ABNORMAL LOW
Glucose, Bld: 72 mg/dL (ref 65–99)
Potassium: 4.7 mmol/L (ref 3.5–5.1)
Sodium: 134 mmol/L — ABNORMAL LOW (ref 135–145)
Total Bilirubin: 0.5 mg/dL (ref 0.3–1.2)
Total Protein: 5 g/dL — ABNORMAL LOW (ref 6.5–8.1)

## 2015-07-29 LAB — I-STAT CHEM 8, ED
BUN: 36 mg/dL — ABNORMAL HIGH (ref 6–20)
CREATININE: 1.3 mg/dL — AB (ref 0.44–1.00)
Calcium, Ion: 1.18 mmol/L (ref 1.13–1.30)
Chloride: 103 mmol/L (ref 101–111)
Glucose, Bld: 66 mg/dL (ref 65–99)
HEMATOCRIT: 33 % — AB (ref 36.0–46.0)
Hemoglobin: 11.2 g/dL — ABNORMAL LOW (ref 12.0–15.0)
Potassium: 4.7 mmol/L (ref 3.5–5.1)
Sodium: 136 mmol/L (ref 135–145)
TCO2: 22 mmol/L (ref 0–100)

## 2015-07-29 LAB — CBC WITH DIFFERENTIAL/PLATELET
Basophils Absolute: 0 K/uL (ref 0.0–0.1)
Basophils Relative: 0 %
Eosinophils Absolute: 0 K/uL (ref 0.0–0.7)
Eosinophils Relative: 0 %
HCT: 30.6 % — ABNORMAL LOW (ref 36.0–46.0)
Hemoglobin: 10.3 g/dL — ABNORMAL LOW (ref 12.0–15.0)
Lymphocytes Relative: 12 %
Lymphs Abs: 1.2 K/uL (ref 0.7–4.0)
MCH: 32.3 pg (ref 26.0–34.0)
MCHC: 33.7 g/dL (ref 30.0–36.0)
MCV: 95.9 fL (ref 78.0–100.0)
Monocytes Absolute: 0.7 K/uL (ref 0.1–1.0)
Monocytes Relative: 7 %
Neutro Abs: 8.6 K/uL — ABNORMAL HIGH (ref 1.7–7.7)
Neutrophils Relative %: 81 %
Platelets: 250 K/uL (ref 150–400)
RBC: 3.19 MIL/uL — ABNORMAL LOW (ref 3.87–5.11)
RDW: 15.2 % (ref 11.5–15.5)
WBC: 10.5 K/uL (ref 4.0–10.5)

## 2015-07-29 LAB — ABO/RH: ABO/RH(D): O POS

## 2015-07-29 LAB — URINALYSIS, ROUTINE W REFLEX MICROSCOPIC
Bilirubin Urine: NEGATIVE
GLUCOSE, UA: NEGATIVE mg/dL
Hgb urine dipstick: NEGATIVE
Ketones, ur: 15 mg/dL — AB
Nitrite: NEGATIVE
PH: 6 (ref 5.0–8.0)
Protein, ur: NEGATIVE mg/dL
Specific Gravity, Urine: 1.016 (ref 1.005–1.030)

## 2015-07-29 LAB — APTT: aPTT: 30 s (ref 24–37)

## 2015-07-29 LAB — URINE MICROSCOPIC-ADD ON: RBC / HPF: NONE SEEN RBC/hpf (ref 0–5)

## 2015-07-29 LAB — I-STAT TROPONIN, ED: Troponin i, poc: 0.04 ng/mL (ref 0.00–0.08)

## 2015-07-29 LAB — TSH: TSH: 4.756 u[IU]/mL — ABNORMAL HIGH (ref 0.350–4.500)

## 2015-07-29 LAB — TYPE AND SCREEN
ABO/RH(D): O POS
ANTIBODY SCREEN: NEGATIVE

## 2015-07-29 LAB — MRSA PCR SCREENING: MRSA by PCR: NEGATIVE

## 2015-07-29 LAB — I-STAT CG4 LACTIC ACID, ED: Lactic Acid, Venous: 2.15 mmol/L (ref 0.5–2.0)

## 2015-07-29 LAB — HEMOGLOBIN AND HEMATOCRIT, BLOOD
HEMATOCRIT: 28 % — AB (ref 36.0–46.0)
HEMOGLOBIN: 9.2 g/dL — AB (ref 12.0–15.0)

## 2015-07-29 LAB — PROTIME-INR
INR: 1.68 — ABNORMAL HIGH (ref 0.00–1.49)
Prothrombin Time: 19.8 s — ABNORMAL HIGH (ref 11.6–15.2)

## 2015-07-29 LAB — POC OCCULT BLOOD, ED: Fecal Occult Bld: POSITIVE — AB

## 2015-07-29 LAB — BRAIN NATRIURETIC PEPTIDE: B Natriuretic Peptide: 339.9 pg/mL — ABNORMAL HIGH (ref 0.0–100.0)

## 2015-07-29 MED ORDER — ACETAMINOPHEN 325 MG PO TABS
650.0000 mg | ORAL_TABLET | Freq: Once | ORAL | Status: AC
  Administered 2015-07-29: 650 mg via ORAL
  Filled 2015-07-29: qty 2

## 2015-07-29 MED ORDER — SODIUM CHLORIDE 0.9 % IV BOLUS (SEPSIS)
500.0000 mL | Freq: Once | INTRAVENOUS | Status: AC
  Administered 2015-07-29: 500 mL via INTRAVENOUS

## 2015-07-29 MED ORDER — SODIUM CHLORIDE 0.9 % IV SOLN
INTRAVENOUS | Status: DC
  Administered 2015-07-29 (×2): via INTRAVENOUS

## 2015-07-29 MED ORDER — SODIUM CHLORIDE 0.9 % IJ SOLN
3.0000 mL | Freq: Two times a day (BID) | INTRAMUSCULAR | Status: DC
  Administered 2015-07-30 – 2015-08-02 (×5): 3 mL via INTRAVENOUS

## 2015-07-29 MED ORDER — GERHARDT'S BUTT CREAM
TOPICAL_CREAM | Freq: Three times a day (TID) | CUTANEOUS | Status: DC | PRN
  Filled 2015-07-29: qty 1

## 2015-07-29 MED ORDER — PANTOPRAZOLE SODIUM 40 MG IV SOLR
40.0000 mg | Freq: Two times a day (BID) | INTRAVENOUS | Status: DC
  Administered 2015-07-29 – 2015-08-02 (×9): 40 mg via INTRAVENOUS
  Filled 2015-07-29 (×9): qty 40

## 2015-07-29 MED ORDER — SODIUM CHLORIDE 0.9 % IV SOLN: INTRAVENOUS | Status: DC

## 2015-07-29 NOTE — ED Notes (Addendum)
Pt presents from home via [facility] with c/o fatigue and generalized weakness for at least one month.  She was seen by her PCP yesterday and told that she was anemic but not badly enough to be admitted.  Pt's daughters present present and state she has been getting so weak that she is unable to stand on her own.  Per EMS she was hypotensive on their arrival to scene -- 80/palp, but came up to 105/palp en route. She is alert and oriented to self and place.  Pt in NAD.

## 2015-07-29 NOTE — ED Notes (Signed)
Xray notified pt ready for portable Xray.

## 2015-07-29 NOTE — ED Provider Notes (Signed)
CSN: [id number]     Arrival date & time 07/29/15  [id number] History   First MD Initiated Contact with Patient 07/29/15 [phone]     Chief Complaint  Patient presents with  . Fatigue  . Weakness     (Consider location/radiation/quality/duration/timing/severity/associated sxs/prior Treatment) HPI Patient has had 6 weeks of progressive generalized weakness and lightheadedness. Symptoms are worse when trying to stand. She is at the point now she is unable to walk. Daughter believes symptoms started shortly after being diagnosed with DVT in the right lower extremity and started on Coumadin. Patient is no longer taking Lasix. Patient denies any chest pain or shortness of breath. She has had ongoing dark stool and daughter notes that she had one episode of bright red rectal bleeding several days ago. Ever since patient has had increased pallor and decreased appetite. Denies fever or chills. Denies focal weakness or numbness. Patient found to be hypotensive with systolic of 80 by EMS. No recent changes to blood pressure medication. She is controlled currently taking metoprolol and lisinopril. Past Medical History  Diagnosis Date  . Hypertension   . DVT (deep venous thrombosis) (HCC)     right Leg  . Cancer [facility])     Breast   Past Surgical History  Procedure Laterality Date  . Mastectomy Right 2001    Right Arm Restricted   No family history on file. Social History  Substance Use Topics  . Smoking status: Never Smoker   . Smokeless tobacco: None  . Alcohol Use: No   OB History    No data available     Review of Systems  Constitutional: Positive for activity change, appetite change and fatigue. Negative for fever and chills.  Eyes: Negative for visual disturbance.  Respiratory: Negative for cough and shortness of breath.   Cardiovascular: Positive for leg swelling. Negative for chest pain and palpitations.  Gastrointestinal: Positive for constipation and blood in stool. Negative for nausea,  vomiting, abdominal pain and diarrhea.  Genitourinary: Negative for dysuria.  Musculoskeletal: Negative for myalgias, back pain, neck pain and neck stiffness.  Skin: Negative for rash and wound.  Neurological: Positive for dizziness, weakness (generalized) and light-headedness. Negative for syncope, numbness and headaches.  All other systems reviewed and are negative.     Allergies  Review of patient's allergies indicates no known allergies.  Home Medications   Prior to Admission medications   Medication Sig Start Date End Date Taking? Authorizing Provider  alendronate (FOSAMAX) 70 MG tablet Take 70 mg by mouth once a week. (Tuesday) Take with a full glass of water on an empty stomach.   Yes Historical Provider, MD  calcium carbonate (OSCAL) 1500 (600 CA) MG TABS tablet Take 600 mg of elemental calcium by mouth 2 (two) times daily with a meal.   Yes Historical Provider, MD  ferrous sulfate 325 (65 FE) MG tablet Take 325 mg by mouth daily with breakfast.   Yes Historical Provider, MD  lisinopril (PRINIVIL,ZESTRIL) 10 MG tablet Take 10 mg by mouth daily.   Yes Historical Provider, MD  metoprolol (LOPRESSOR) 100 MG tablet Take 50 mg by mouth daily.   Yes Historical Provider, MD  rivaroxaban (XARELTO) 20 MG TABS tablet Take 20 mg by mouth daily with supper.   Yes Historical Provider, MD   BP 102/57 mmHg  Pulse 62  Temp(Src)   Resp 18  SpO2 100% Physical Exam  Constitutional: She is oriented to person, place, and time. She appears well-developed. No distress.  Frail-appearing  HENT:  Head: Normocephalic and atraumatic.  Mouth/Throat: Oropharynx is clear and moist. No oropharyngeal exudate.  Pale mucous membranes  Eyes: EOM are normal. Pupils are equal, round, and reactive to light. Right eye exhibits no discharge. Left eye exhibits no discharge.  Neck: Normal range of motion. Neck supple. No JVD present.  Cardiovascular: Normal rate and regular rhythm.  Exam reveals no gallop and no  friction rub.   No murmur heard. Pulmonary/Chest: Effort normal and breath sounds normal. No stridor. No respiratory distress. She has no wheezes. She has no rales. She exhibits no tenderness.  Abdominal: Soft. Bowel sounds are normal. She exhibits no distension and no mass. There is no tenderness. There is no rebound and no guarding.  Musculoskeletal: Normal range of motion. She exhibits no edema or tenderness.  Difficult to palpate distal pulses. Patient has right lower extremity swelling compared to left. Tenderness to palpation of the right calf.  Lymphadenopathy:    She has no cervical adenopathy.  Neurological: She is alert and oriented to person, place, and time.  5/5 motor strength. Sensation is intact.  Skin: Skin is warm and dry. No rash noted. No erythema. There is pallor.  Psychiatric: She has a normal mood and affect. Her behavior is normal.  Nursing note and vitals reviewed.   ED Course  Procedures (including critical care time) Labs Review Labs Reviewed  CBC WITH DIFFERENTIAL/PLATELET - Abnormal; Notable for the following:    RBC 3.19 (*)    Hemoglobin 10.3 (*)    HCT 30.6 (*)    Neutro Abs 8.6 (*)    All other components within normal limits  COMPREHENSIVE METABOLIC PANEL - Abnormal; Notable for the following:    Sodium 134 (*)    BUN 37 (*)    Creatinine, Ser 1.26 (*)    Calcium 7.9 (*)    Total Protein 5.0 (*)    Albumin 1.4 (*)    GFR calc non Af Amer 37 (*)    GFR calc Af Amer 42 (*)    All other components within normal limits  PROTIME-INR - Abnormal; Notable for the following:    Prothrombin Time 19.8 (*)    INR 1.68 (*)    All other components within normal limits  URINALYSIS, ROUTINE W REFLEX MICROSCOPIC (NOT AT [facility]) - Abnormal; Notable for the following:    APPearance HAZY (*)    Ketones, ur 15 (*)    Leukocytes, UA SMALL (*)    All other components within normal limits  URINE MICROSCOPIC-ADD ON - Abnormal; Notable for the following:    Squamous  Epithelial / LPF 0-5 (*)    Bacteria, UA MANY (*)    Casts HYALINE CASTS (*)    All other components within normal limits  I-STAT CG4 LACTIC ACID, ED - Abnormal; Notable for the following:    Lactic Acid, Venous 2.15 (*)    All other components within normal limits  I-STAT CHEM 8, ED - Abnormal; Notable for the following:    BUN 36 (*)    Creatinine, Ser 1.30 (*)    Hemoglobin 11.2 (*)    HCT 33.0 (*)    All other components within normal limits  POC OCCULT BLOOD, ED - Abnormal; Notable for the following:    Fecal Occult Bld POSITIVE (*)    All other components within normal limits  CULTURE, BLOOD (ROUTINE X 2)  CULTURE, BLOOD (ROUTINE X 2)  APTT  OCCULT BLOOD X 1 CARD TO LAB, STOOL  I-STAT TROPOININ, ED  TYPE AND SCREEN    Imaging Review Dg Chest Port 1 View  07/29/2015  CLINICAL DATA:  Hypotension EXAM: PORTABLE CHEST 1 VIEW COMPARISON:  None. FINDINGS: There are small pleural effusions bilaterally. There is no edema or consolidation. Heart is upper normal in size with pulmonary vascularity within normal limits. No adenopathy. Bones are osteoporotic. There are surgical clips the right axillary region. Patient has had a previous mastectomy on the right. There old healed rib fractures on the left. IMPRESSION: Small pleural effusions bilaterally. Lungs otherwise clear. Heart upper normal in size. Bones osteoporotic. Electronically Signed   By: Lowella Grip III M.D.   On: 07/29/2015 08:55   I have personally reviewed and evaluated these images and lab results as part of my medical decision-making.   EKG Interpretation   Date/Time:  Friday July 29 2015 08:00:19 EST Ventricular Rate:  73 PR Interval:  128 QRS Duration: 92 QT Interval:  374 QTC Calculation: 412 R Axis:   52 Text Interpretation:  Sinus rhythm Low voltage, extremity leads Confirmed  by Lita Mains  MD, Shloime Keilman ([id number]) on 07/29/2015 9:31:05 AM     CRITICAL CARE Performed by: Lita Mains, Rochell Mabie Total critical  care time: 25 minutes Critical care time was exclusive of separately billable procedures and treating other patients. Critical care was necessary to treat or prevent imminent or life-threatening deterioration. Critical care was time spent personally by me on the following activities: development of treatment plan with patient and/or surrogate as well as nursing, discussions with consultants, evaluation of patient's response to treatment, examination of patient, obtaining history from patient or surrogate, ordering and performing treatments and interventions, ordering and review of laboratory studies, ordering and review of radiographic studies, pulse oximetry and re-evaluation of patient's condition.  MDM   Final diagnoses:  Hypotension, unspecified hypotension type  Occult GI bleeding  Generalized weakness   blood pressure initially in 70s/80s improves with several boluses of IV fluids. Hemoglobin is stable. Patient was typed and screened in the emergency department. Discussed with Dr.Buriev, hospitalist. Will admit to telemetry bed.      Julianne Rice, MD 07/29/15 [phone]

## 2015-07-29 NOTE — H&P (Signed)
[facility] History and Physical  Lacey Morgan [id number] DOB: [birth year] DOA: 07/29/2015  Referring physician:  PCP: Haywood Pao, MD  Specialists:   Chief Complaint: weakness, hypotension, black stools   HPI: NICKEY Morgan is a 81 y.o. female with PMH of HTN, Anemia, Recent DVT (on xarelto) presented with progressive generalized weakness, lightheadedness. Patient was unable to walk independently lately, so family brought her to ED. She also reports having black tarry stools for few weeks. Daughter believes that symptoms started after taking xarelto for DVT. Patient denies acute chest pains, no SOB, no fever, cough. She denies abdominal pains, no focal weakness but reports overall tiredness.  -ED: patient found to have hypotension 83/50. Occult blood test is positive.   Review of Systems: The patient denies anorexia, fever, weight loss,, vision loss, decreased hearing, hoarseness, chest pain, syncope, dyspnea on exertion, peripheral edema, balance deficits, hemoptysis, abdominal pain, melena, hematochezia, severe indigestion/heartburn, hematuria, incontinence, genital sores, muscle weakness, suspicious skin lesions, transient blindness, difficulty walking, depression, unusual weight change, abnormal bleeding, enlarged lymph nodes, angioedema, and breast masses.    Past Medical History  Diagnosis Date  . Hypertension   . DVT (deep venous thrombosis) (HCC)     right Leg  . Cancer [facility])     Breast   Past Surgical History  Procedure Laterality Date  . Mastectomy Right 2001    Right Arm Restricted   Social History:  reports that she has never smoked. She does not have any smokeless tobacco history on file. She reports that she does not drink alcohol or use illicit drugs. Home;  where does patient live--home, ALF, SNF? and with whom if at home? No;  Can patient participate in ADLs?  No Known Allergies NKDA No family history on file. reports h/o CAD  (be sure to  complete)  Prior to Admission medications   Medication Sig Start Date End Date Taking? Authorizing Provider  alendronate (FOSAMAX) 70 MG tablet Take 70 mg by mouth once a week. (Tuesday) Take with a full glass of water on an empty stomach.   Yes Historical Provider, MD  calcium carbonate (OSCAL) 1500 (600 CA) MG TABS tablet Take 600 mg of elemental calcium by mouth 2 (two) times daily with a meal.   Yes Historical Provider, MD  ferrous sulfate 325 (65 FE) MG tablet Take 325 mg by mouth daily with breakfast.   Yes Historical Provider, MD  lisinopril (PRINIVIL,ZESTRIL) 10 MG tablet Take 10 mg by mouth daily.   Yes Historical Provider, MD  metoprolol (LOPRESSOR) 100 MG tablet Take 50 mg by mouth daily.   Yes Historical Provider, MD  rivaroxaban (XARELTO) 20 MG TABS tablet Take 20 mg by mouth daily with supper.   Yes Historical Provider, MD   Physical Exam: Filed Vitals:   07/29/15 0945 07/29/15 1000  BP: 97/60 92/63  Pulse:  69  Resp: 19 19     General:  Alert, oriented   Eyes: eom-i, perrla   ENT: no oral ucers   Neck: supple, no JVD  Cardiovascular: s1,s2 rrr  Respiratory: diminished in LL. No wheezing    Abdomen: soft, nt,nd   Skin: no rash   Musculoskeletal: + pedal edema   Psychiatric: no hallucinations   Neurologic: CN 2-12 intact. Motor 5/5 BL   Labs on Admission:  Basic Metabolic Panel:  Recent Labs Lab 07/29/15 0900 07/29/15 0923  NA 134* 136  K 4.7 4.7  CL 107 103  CO2 22  --   GLUCOSE  72 66  BUN 37* 36*  CREATININE 1.26* 1.30*  CALCIUM 7.9*  --    Liver Function Tests:  Recent Labs Lab 07/29/15 0900  AST 36  ALT 32  ALKPHOS 101  BILITOT 0.5  PROT 5.0*  ALBUMIN 1.4*   No results for input(s): LIPASE, AMYLASE in the last 168 hours. No results for input(s): AMMONIA in the last 168 hours. CBC:  Recent Labs Lab 07/29/15 0900 07/29/15 0923  WBC 10.5  --   NEUTROABS 8.6*  --   HGB 10.3* 11.2*  HCT 30.6* 33.0*  MCV 95.9  --   PLT 250   --    Cardiac Enzymes: No results for input(s): CKTOTAL, CKMB, CKMBINDEX, TROPONINI in the last 168 hours.  BNP (last 3 results)  Recent Labs  06/04/15 1751  BNP 210.0*    ProBNP (last 3 results) No results for input(s): PROBNP in the last 8760 hours.  CBG: No results for input(s): GLUCAP in the last 168 hours.  Radiological Exams on Admission: Dg Chest Port 1 View  07/29/2015  CLINICAL DATA:  Hypotension EXAM: PORTABLE CHEST 1 VIEW COMPARISON:  None. FINDINGS: There are small pleural effusions bilaterally. There is no edema or consolidation. Heart is upper normal in size with pulmonary vascularity within normal limits. No adenopathy. Bones are osteoporotic. There are surgical clips the right axillary region. Patient has had a previous mastectomy on the right. There old healed rib fractures on the left. IMPRESSION: Small pleural effusions bilaterally. Lungs otherwise clear. Heart upper normal in size. Bones osteoporotic. Electronically Signed   By: Lowella Grip III M.D.   On: 07/29/2015 08:55    EKG: Independently reviewed.   Assessment/Plan Active Problems:   Hypotension   GI bleeding   81 y.o. female with PMH of HTN, Anemia, Recent DVT (on xarelto) presented with progressive generalized weakness, lightheadedness. -admitted with hypotension, GI bleeding   1. GI Bleeding. Repeat Hg is stable. 10.3->11.2. But hem occult test "+". remains at risk for bleed due to xarelto.  -we will cont close monitor, serial Hg. Type/screen TF prn. Start PPI. Consulted GI evaluation. Dr. Benson Norway   2. Hypotension. Unclear etiology. ? Related to BP meds vs bleeding. No s/s of systemic infection, afebrile, no leukocytosis. Pt had some back stools Hg is stable.  -BP is improving on IVF. Cont IVF as needed, avoid fluid overload. We will TF PRBC as needed. Check tsh  3. Recent DVT on xarelto. (6 weeks ago) -we will repeat Leg US doppler to follow up. Hold xarelto due to GI bleeding  4. Chronic  intermittent leg edema. Now with DOE. Check echo. BnP 5. HTN. hold BP meds due to Hypotension.  6. Generalized  Weakness. Exam is non focal. Obtain PT/OT   D/w patient, confirmed with family at the bedside. Patient is DNR   GI.  if consultant consulted, please document name and whether formally or informally consulted  Code Status: DNR (must indicate code status--if unknown or must be presumed, indicate so) Family Communication: d/w patient, her daughter, family at the bedside  (indicate person spoken with, if applicable, with phone number if by telephone) Disposition Plan: pend clinical improvement  (indicate anticipated LOS)  Time spent: >45 minutes   Kinnie Feil [facility] Pager [phone]  If 7PM-7AM, please contact night-coverage [email or web address] Password [email or web address] 07/29/2015, 10:16 AM

## 2015-07-29 NOTE — ED Notes (Signed)
Admitting MD at bedside.

## 2015-07-29 NOTE — Progress Notes (Signed)
  Echocardiogram 2D Echocardiogram has been performed.  Lacey Morgan 07/29/2015, 3:52 PM

## 2015-07-29 NOTE — ED Notes (Signed)
IV team at bedside 

## 2015-07-29 NOTE — ED Notes (Signed)
Pt's family member out to desk to request Tylenol for patient's headache. Dr. Lita Mains aware.

## 2015-07-29 NOTE — Progress Notes (Signed)
   07/29/15 1400  Clinical Encounter Type  Visited With Patient and family together  Visit Type Initial  Referral From Nurse;Patient  Consult/Referral To Chaplain  Spiritual Encounters  Spiritual Needs Emotional  Advance Directives (For Healthcare)  Does patient have an advance directive? No;Yes  Would patient like information on creating an advanced directive? No - patient declined information  Type of Advance Directive Healthcare Power of Attorney  Does patient want to make changes to advanced directive? No - Patient declined  Copy of advanced directive(s) in chart? No - copy requested  Wharton stopped by pt room and introduced [place] to pt and family; Iselin will return and follow-up request for prayer.  Gwynn Burly 2:53 PM

## 2015-07-29 NOTE — ED Notes (Signed)
Just spoke with admitting MD, made aware of low BP. Pt has telemetry assignment, MD reports will continue to monitor BP. Pt is a x 4. Denies feeling poorer with BP. Maintenance fluids started. Pt remains NPO.

## 2015-07-29 NOTE — Progress Notes (Signed)
Initial Nutrition Assessment  DOCUMENTATION CODES:   Not applicable  INTERVENTION:   Diet advancement as able per MD.  NUTRITION DIAGNOSIS:   Inadequate oral intake related to altered GI function, inability to eat as evidenced by NPO status.  GOAL:   Patient will meet greater than or equal to 90% of their needs  MONITOR:   Diet advancement, PO intake, Labs, Weight trends  REASON FOR ASSESSMENT:   Malnutrition Screening Tool    ASSESSMENT:   81 y.o. female with PMH of HTN, Anemia, Recent DVT (on xarelto) presented with progressive generalized weakness, lightheadedness. Patient was unable to walk independently lately, so family brought her to ED. She also reports having black tarry stools for few weeks. In the ED patient was found to have hypotension 83/50. Occult blood test is positive.   Unable to speak with patient at this time. She is in a procedure in her room. Unable to complete Nutrition-Focused physical exam at this time. From review of usual weights in EMR, patient is 2 lbs above usual weight from 2 months ago. Patient is currently NPO due to GI bleeding.   Diet Order:  Diet NPO time specified Except for: Sips with Meds  Skin:  Reviewed, no issues  Last BM:  12/16 (loose, black)  Height:   Ht Readings from Last 1 Encounters:  07/29/15 5\' 1"  (1.549 m)    Weight:   Wt Readings from Last 1 Encounters:  07/29/15 111 lb 15.9 oz (50.8 kg)    Ideal Body Weight:  47.7 kg  BMI:  Body mass index is 21.17 kg/(m^2).  Estimated Nutritional Needs:   Kcal:  1250-1450  Protein:  70-80 gm  Fluid:  1.5 L  EDUCATION NEEDS:   No education needs identified at this time  Molli Barrows, [employer], [place], [place] Pager [phone] After Hours Pager [phone]

## 2015-07-29 NOTE — ED Notes (Signed)
Spoke with Dr. Daleen Bo regarding BP of 88/51. Pt and family updated.

## 2015-07-29 NOTE — Consult Note (Signed)
Reason for Consult: Anemia and melena Referring Physician: [facility]  Lacey Morgan HPI: This is an 81 year old female with a PMH of anemia, HTN, breast cancer s/p mastectomy 2001, and DVT on Xarelto admitted for weakness.  The patient was unable to ambulate and she was lightheaded.  In the ER her SBP was in the 70's, but it increased with IV hydration to the 90's and she was not tachycardic.  Her HGB dropped with the IV hydration down to the 9 range from her baseline of 10-11.  With the recent DVT, Xarelto was initiated and she reported issues with melena.  Her family member states that it has been daily for the past week and she was started on the medication six weeks ago.  No complaints of chest pain, SOB, or abdominal pain.  In the past Dr. Collene Mares worked her up for anemia in 2005 and 2006.  Her EGD and colonoscopy were in 2005 with findings of a large hiatal hernia with the EGD.  The colonoscopy prep was rated as fair.  In 2006 a capsule endoscopy was performed and it was negative for pathology.  Past Medical History  Diagnosis Date  . Hypertension   . DVT (deep venous thrombosis) (HCC)     right Leg  . Cancer [facility])     Breast    Past Surgical History  Procedure Laterality Date  . Mastectomy Right 2001    Right Arm Restricted    No family history on file.  Social History:  reports that she has never smoked. She does not have any smokeless tobacco history on file. She reports that she does not drink alcohol or use illicit drugs.  Allergies: No Known Allergies  Medications:  Scheduled: . pantoprazole (PROTONIX) IV  40 mg Intravenous Q12H  . sodium chloride  3 mL Intravenous Q12H   Continuous: . sodium chloride 75 mL/hr at 07/29/15 1125    Results for orders placed or performed during the hospital encounter of 07/29/15 (from the past 24 hour(s))  Urinalysis, Routine w reflex microscopic (not at [facility])     Status: Abnormal   Collection Time: 07/29/15  8:11 AM  Result  Value Ref Range   Color, Urine YELLOW YELLOW   APPearance HAZY (A) CLEAR   Specific Gravity, Urine 1.016 1.005 - 1.030   pH 6.0 5.0 - 8.0   Glucose, UA NEGATIVE NEGATIVE mg/dL   Hgb urine dipstick NEGATIVE NEGATIVE   Bilirubin Urine NEGATIVE NEGATIVE   Ketones, ur 15 (A) NEGATIVE mg/dL   Protein, ur NEGATIVE NEGATIVE mg/dL   Nitrite NEGATIVE NEGATIVE   Leukocytes, UA SMALL (A) NEGATIVE  Urine microscopic-add on     Status: Abnormal   Collection Time: 07/29/15  8:11 AM  Result Value Ref Range   Squamous Epithelial / LPF 0-5 (A) NONE SEEN   WBC, UA 0-5 0 - 5 WBC/hpf   RBC / HPF NONE SEEN 0 - 5 RBC/hpf   Bacteria, UA MANY (A) NONE SEEN   Casts HYALINE CASTS (A) NEGATIVE  POC occult blood, ED     Status: Abnormal   Collection Time: 07/29/15  8:32 AM  Result Value Ref Range   Fecal Occult Bld POSITIVE (A) NEGATIVE  CBC with Differential/Platelet     Status: Abnormal   Collection Time: 07/29/15  9:00 AM  Result Value Ref Range   WBC 10.5 4.0 - 10.5 K/uL   RBC 3.19 (L) 3.87 - 5.11 MIL/uL   Hemoglobin 10.3 (L) 12.0 - 15.0  g/dL   HCT 30.6 (L) 36.0 - 46.0 %   MCV 95.9 78.0 - 100.0 fL   MCH 32.3 26.0 - 34.0 pg   MCHC 33.7 30.0 - 36.0 g/dL   RDW 15.2 11.5 - 15.5 %   Platelets 250 150 - 400 K/uL   Neutrophils Relative % 81 %   Neutro Abs 8.6 (H) 1.7 - 7.7 K/uL   Lymphocytes Relative 12 %   Lymphs Abs 1.2 0.7 - 4.0 K/uL   Monocytes Relative 7 %   Monocytes Absolute 0.7 0.1 - 1.0 K/uL   Eosinophils Relative 0 %   Eosinophils Absolute 0.0 0.0 - 0.7 K/uL   Basophils Relative 0 %   Basophils Absolute 0.0 0.0 - 0.1 K/uL  Comprehensive metabolic panel     Status: Abnormal   Collection Time: 07/29/15  9:00 AM  Result Value Ref Range   Sodium 134 (L) 135 - 145 mmol/L   Potassium 4.7 3.5 - 5.1 mmol/L   Chloride 107 101 - 111 mmol/L   CO2 22 22 - 32 mmol/L   Glucose, Bld 72 65 - 99 mg/dL   BUN 37 (H) 6 - 20 mg/dL   Creatinine, Ser 1.26 (H) 0.44 - 1.00 mg/dL   Calcium 7.9 (L) 8.9 -  10.3 mg/dL   Total Protein 5.0 (L) 6.5 - 8.1 g/dL   Albumin 1.4 (L) 3.5 - 5.0 g/dL   AST 36 15 - 41 U/L   ALT 32 14 - 54 U/L   Alkaline Phosphatase 101 38 - 126 U/L   Total Bilirubin 0.5 0.3 - 1.2 mg/dL   GFR calc non Af Amer 37 (L) >60 mL/min   GFR calc Af Amer 42 (L) >60 mL/min   Anion gap 5 5 - 15  Protime-INR     Status: Abnormal   Collection Time: 07/29/15  9:00 AM  Result Value Ref Range   Prothrombin Time 19.8 (H) 11.6 - 15.2 seconds   INR 1.68 (H) 0.00 - 1.49  APTT     Status: None   Collection Time: 07/29/15  9:00 AM  Result Value Ref Range   aPTT 30 24 - 37 seconds  Type and screen     Status: None   Collection Time: 07/29/15  9:00 AM  Result Value Ref Range   ABO/RH(D) O POS    Antibody Screen NEG    Sample Expiration 08/01/2015   ABO/Rh     Status: None   Collection Time: 07/29/15  9:00 AM  Result Value Ref Range   ABO/RH(D) O POS   I-stat troponin, ED     Status: None   Collection Time: 07/29/15  9:21 AM  Result Value Ref Range   Troponin i, poc 0.04 0.00 - 0.08 ng/mL   Comment 3          I-stat chem 8, ed     Status: Abnormal   Collection Time: 07/29/15  9:23 AM  Result Value Ref Range   Sodium 136 135 - 145 mmol/L   Potassium 4.7 3.5 - 5.1 mmol/L   Chloride 103 101 - 111 mmol/L   BUN 36 (H) 6 - 20 mg/dL   Creatinine, Ser 1.30 (H) 0.44 - 1.00 mg/dL   Glucose, Bld 66 65 - 99 mg/dL   Calcium, Ion 1.18 1.13 - 1.30 mmol/L   TCO2 22 0 - 100 mmol/L   Hemoglobin 11.2 (L) 12.0 - 15.0 g/dL   HCT 33.0 (L) 36.0 - 46.0 %  I-Stat CG4 Lactic Acid,  ED     Status: Abnormal   Collection Time: 07/29/15  9:24 AM  Result Value Ref Range   Lactic Acid, Venous 2.15 (HH) 0.5 - 2.0 mmol/L   Comment NOTIFIED PHYSICIAN   TSH     Status: Abnormal   Collection Time: 07/29/15 12:54 PM  Result Value Ref Range   TSH 4.756 (H) 0.350 - 4.500 uIU/mL  Brain natriuretic peptide     Status: Abnormal   Collection Time: 07/29/15 12:54 PM  Result Value Ref Range   B Natriuretic  Peptide 339.9 (H) 0.0 - 100.0 pg/mL  Hemoglobin and hematocrit, blood     Status: Abnormal   Collection Time: 07/29/15 12:54 PM  Result Value Ref Range   Hemoglobin 9.2 (L) 12.0 - 15.0 g/dL   HCT 28.0 (L) 36.0 - 46.0 %     Dg Chest Port 1 View  07/29/2015  CLINICAL DATA:  Hypotension EXAM: PORTABLE CHEST 1 VIEW COMPARISON:  None. FINDINGS: There are small pleural effusions bilaterally. There is no edema or consolidation. Heart is upper normal in size with pulmonary vascularity within normal limits. No adenopathy. Bones are osteoporotic. There are surgical clips the right axillary region. Patient has had a previous mastectomy on the right. There old healed rib fractures on the left. IMPRESSION: Small pleural effusions bilaterally. Lungs otherwise clear. Heart upper normal in size. Bones osteoporotic. Electronically Signed   By: Lowella Grip III M.D.   On: 07/29/2015 08:55    ROS:  As stated above in the HPI otherwise negative.  Blood pressure 97/56, pulse 64, temperature 97.6 F (36.4 C), temperature source Oral, resp. rate 17, height 5\' 1"  (1.549 m), weight 50.8 kg (111 lb 15.9 oz), SpO2 100 %.    PE: Gen: NAD, Alert and Oriented HEENT:  [place]/AT, EOMI Neck: Supple, no LAD Lungs: CTA Bilaterally CV: RRR without M/G/R ABM: Soft, NTND, +BS Ext: No C/C/E  Assessment/Plan: 1) Symptomatic anemia. 2) Melena. 3) DVT on Xarelto.   Her blood pressure is too low to perform the EGD today.  Fortunately she is hemodynamically stable.  Hopefully her HGB will increase over night.    Plan: 1) EGD tomorrow. 2) Follow HGB and transfuse if necessary. 3) Continue with Protonix. 4) Agree with holding Xarelto.  Laela Deviney D 07/29/2015, 3:24 PM

## 2015-07-29 NOTE — ED Notes (Signed)
Unable to obtain second IV access or lab work x2 attempts. Phlebotomy aware.

## 2015-07-30 ENCOUNTER — Inpatient Hospital Stay (HOSPITAL_COMMUNITY): Payer: Medicare Other

## 2015-07-30 DIAGNOSIS — D62 Acute posthemorrhagic anemia: Secondary | ICD-10-CM

## 2015-07-30 DIAGNOSIS — N179 Acute kidney failure, unspecified: Secondary | ICD-10-CM

## 2015-07-30 DIAGNOSIS — I82409 Acute embolism and thrombosis of unspecified deep veins of unspecified lower extremity: Secondary | ICD-10-CM

## 2015-07-30 DIAGNOSIS — R579 Shock, unspecified: Secondary | ICD-10-CM

## 2015-07-30 LAB — CBC
HCT: 26.4 % — ABNORMAL LOW (ref 36.0–46.0)
HCT: 24.1 % — ABNORMAL LOW (ref 36.0–46.0)
HEMATOCRIT: 28.6 % — AB (ref 36.0–46.0)
HEMOGLOBIN: 9.4 g/dL — AB (ref 12.0–15.0)
Hemoglobin: 8.8 g/dL — ABNORMAL LOW (ref 12.0–15.0)
Hemoglobin: 8.3 g/dL — ABNORMAL LOW (ref 12.0–15.0)
MCH: 31.9 pg (ref 26.0–34.0)
MCH: 31.5 pg (ref 26.0–34.0)
MCH: 32.2 pg (ref 26.0–34.0)
MCHC: 33.3 g/dL (ref 30.0–36.0)
MCHC: 32.9 g/dL (ref 30.0–36.0)
MCHC: 34.4 g/dL (ref 30.0–36.0)
MCV: 95.7 fL (ref 78.0–100.0)
MCV: 96 fL (ref 78.0–100.0)
MCV: 93.4 fL (ref 78.0–100.0)
PLATELETS: 216 10*3/uL (ref 150–400)
PLATELETS: 176 10*3/uL (ref 150–400)
Platelets: 239 10*3/uL (ref 150–400)
RBC: 2.76 MIL/uL — AB (ref 3.87–5.11)
RBC: 2.98 MIL/uL — ABNORMAL LOW (ref 3.87–5.11)
RBC: 2.58 MIL/uL — AB (ref 3.87–5.11)
RDW: 15.7 % — ABNORMAL HIGH (ref 11.5–15.5)
RDW: 15.6 % — AB (ref 11.5–15.5)
RDW: 15.6 % — ABNORMAL HIGH (ref 11.5–15.5)
WBC: 9.4 10*3/uL (ref 4.0–10.5)
WBC: 10.2 10*3/uL (ref 4.0–10.5)
WBC: 7.5 10*3/uL (ref 4.0–10.5)

## 2015-07-30 LAB — BASIC METABOLIC PANEL
ANION GAP: 6 (ref 5–15)
BUN: 35 mg/dL — ABNORMAL HIGH (ref 6–20)
CALCIUM: 7.1 mg/dL — AB (ref 8.9–10.3)
CO2: 18 mmol/L — ABNORMAL LOW (ref 22–32)
Chloride: 112 mmol/L — ABNORMAL HIGH (ref 101–111)
Creatinine, Ser: 1.18 mg/dL — ABNORMAL HIGH (ref 0.44–1.00)
GFR, EST AFRICAN AMERICAN: 46 mL/min — AB (ref >60)
GFR, EST NON AFRICAN AMERICAN: 40 mL/min — AB (ref >60)
Glucose, Bld: 45 mg/dL — ABNORMAL LOW (ref 65–99)
POTASSIUM: 4.3 mmol/L (ref 3.5–5.1)
SODIUM: 136 mmol/L (ref 135–145)

## 2015-07-30 LAB — HEMOGLOBIN AND HEMATOCRIT, BLOOD
HEMATOCRIT: 27.5 % — AB (ref 36.0–46.0)
HEMOGLOBIN: 9.1 g/dL — AB (ref 12.0–15.0)

## 2015-07-30 LAB — LACTIC ACID, PLASMA: LACTIC ACID, VENOUS: 1.1 mmol/L (ref 0.5–2.0)

## 2015-07-30 MED ORDER — WHITE PETROLATUM GEL
Status: AC
  Administered 2015-07-30: 1
  Filled 2015-07-30: qty 1

## 2015-07-30 MED ORDER — ALBUMIN HUMAN 25 % IV SOLN
12.5000 g | Freq: Four times a day (QID) | INTRAVENOUS | Status: AC
  Administered 2015-07-30 – 2015-07-31 (×4): 12.5 g via INTRAVENOUS
  Filled 2015-07-30 (×3): qty 50

## 2015-07-30 MED ORDER — METOPROLOL TARTRATE 50 MG PO TABS
50.0000 mg | ORAL_TABLET | Freq: Every day | ORAL | Status: DC
  Administered 2015-07-31 – 2015-08-01 (×2): 50 mg via ORAL
  Filled 2015-07-30 (×2): qty 1

## 2015-07-30 MED ORDER — SODIUM CHLORIDE 0.45 % IV SOLN
INTRAVENOUS | Status: DC
  Administered 2015-07-30: 75 mL/h via INTRAVENOUS

## 2015-07-30 MED ORDER — SODIUM CHLORIDE 0.9 % IV BOLUS (SEPSIS)
500.0000 mL | Freq: Once | INTRAVENOUS | Status: AC
  Administered 2015-07-30: 500 mL via INTRAVENOUS

## 2015-07-30 MED ORDER — SODIUM CHLORIDE 0.9 % IV BOLUS (SEPSIS)
500.0000 mL | Freq: Once | INTRAVENOUS | Status: AC
Start: 2015-07-30 — End: 2015-07-30
  Administered 2015-07-30: 500 mL via INTRAVENOUS

## 2015-07-30 NOTE — Progress Notes (Signed)
Md notified of pt's bp 70's/50s, 500cc bolus ordered. Will continue to monitor.

## 2015-07-30 NOTE — Progress Notes (Signed)
[facility] PROGRESS NOTE    Progress Note   BRONA FINNER [id number] DOB: 05-02-1924 DOA: 08/20/15 PCP: Haywood Pao, MD   Brief Narrative:   GUNHILD BURDINE is an 81 y.o. female past medical history of breast cancer in 2001 status post vasectomy, DVT on Xarelto that comes in for weakness and unable to ambulate. Previous EGD and colonoscopy in 2005 and 2006. Her hemoglobin dropped after IV hydration from 11-9. GI was consulted she scheduled for an endoscopy on 07/30/2015.  Assessment/Plan:   Hemorrhagic shock/ GI bleeding/Acute blood loss anemia: Blood pressure still running a little bit soft, her antihypertensive medications were DC'd. She has gotten a liter of normal fluid overnight. Her chloride is trending up and her bicarbonate is trending down. Check lactic acid. Change IV fluids to half-normal saline. Screen 2 units of packed red blood cells continue Protonix. For EGD on 07/30/2015  AKI (acute kidney injury) ([place]) - Improving after IV hydration, likely due to acute blood loss.  Recent diagnosis of DVT: She was recently diagnosed with a DVT 6 weeks ago, Xarelto was stopped over extremity Doppler was held.     DVT Prophylaxis - SCDs  Family Communication: none Disposition Plan: Home when stable. Code Status:     Code Status Orders        Start     Ordered   08/20/15 1225  Do not attempt resuscitation (DNR)   Continuous    Question Answer Comment  In the event of cardiac or respiratory ARREST Do not call a "code blue"   In the event of cardiac or respiratory ARREST Do not perform Intubation, CPR, defibrillation or ACLS   In the event of cardiac or respiratory ARREST Use medication by any route, position, wound care, and other measures to relive pain and suffering. May use oxygen, suction and manual treatment of airway obstruction as needed for comfort.      2015/08/20 1224    Advance Directive Documentation        Most Recent Value   Type  of Advance Directive  Healthcare Power of Attorney   Pre-existing out of facility DNR order (yellow form or pink MOST form)     "MOST" Form in Place?          IV Access:    Peripheral IV   Procedures and diagnostic studies:   Dg Chest Port 1 View  08-20-15  CLINICAL DATA:  Hypotension EXAM: PORTABLE CHEST 1 VIEW COMPARISON:  None. FINDINGS: There are small pleural effusions bilaterally. There is no edema or consolidation. Heart is upper normal in size with pulmonary vascularity within normal limits. No adenopathy. Bones are osteoporotic. There are surgical clips the right axillary region. Patient has had a previous mastectomy on the right. There old healed rib fractures on the left. IMPRESSION: Small pleural effusions bilaterally. Lungs otherwise clear. Heart upper normal in size. Bones osteoporotic. Electronically Signed   By: Lowella Grip III M.D.   On: 20-Aug-2015 08:55     Medical Consultants:    None.  Anti-Infectives:   Anti-infectives    None      Subjective:    ZORAH BUSSINGER no complaints. She is able to carry on a conversation she is alert awake and oriented. Not hungry.  Objective:    Filed Vitals:   2015-08-20 1900 2015/08/20 2019 08-20-2015 2352 07/30/15 0452  BP: 90/46 102/61 86/49 74/52   Pulse: 70 78 73 81  Temp:  98 F (36.7 C) 98 F (36.7 C)  97.4 F (36.3 C)  TempSrc:  Oral Oral Oral  Resp: 13 17 17 25   Height:      Weight:      SpO2: 100% 100% 100% 100%    Intake/Output Summary (Last 24 hours) at 07/30/15 0716 Last data filed at 07/29/15 2000  Gross per 24 hour  Intake 643.75 ml  Output      0 ml  Net 643.75 ml   Filed Weights   07/29/15 1345  Weight: 50.8 kg (111 lb 15.9 oz)    Exam: Gen:  NAD Cardiovascular:  RRR. Chest and lungs:   CTAB Abdomen:  Abdomen soft, NT/ND, + BS Extremities:  No lower extremity edema.   Data Reviewed:    Labs: Basic Metabolic Panel:  Recent Labs Lab 07/29/15 0900 07/29/15 0923  07/30/15 0443  NA 134* 136 136  K 4.7 4.7 4.3  CL 107 103 112*  CO2 22  --  18*  GLUCOSE 72 66 45*  BUN 37* 36* 35*  CREATININE 1.26* 1.30* 1.18*  CALCIUM 7.9*  --  7.1*   GFR Estimated Creatinine Clearance: 24.4 mL/min (by C-G formula based on Cr of 1.18). Liver Function Tests:  Recent Labs Lab 07/29/15 0900  AST 36  ALT 32  ALKPHOS 101  BILITOT 0.5  PROT 5.0*  ALBUMIN 1.4*   No results for input(s): LIPASE, AMYLASE in the last 168 hours. No results for input(s): AMMONIA in the last 168 hours. Coagulation profile  Recent Labs Lab 07/29/15 0900  INR 1.68*    CBC:  Recent Labs Lab 07/29/15 0900 07/29/15 0923 07/29/15 1254 07/30/15 0003 07/30/15 0443  WBC 10.5  --   --   --  9.4  NEUTROABS 8.6*  --   --   --   --   HGB 10.3* 11.2* 9.2* 9.1* 8.8*  HCT 30.6* 33.0* 28.0* 27.5* 26.4*  MCV 95.9  --   --   --  95.7  PLT 250  --   --   --  216   Cardiac Enzymes: No results for input(s): CKTOTAL, CKMB, CKMBINDEX, TROPONINI in the last 168 hours. BNP (last 3 results) No results for input(s): PROBNP in the last 8760 hours. CBG: No results for input(s): GLUCAP in the last 168 hours. D-Dimer: No results for input(s): DDIMER in the last 72 hours. Hgb A1c: No results for input(s): HGBA1C in the last 72 hours. Lipid Profile: No results for input(s): CHOL, HDL, LDLCALC, TRIG, CHOLHDL, LDLDIRECT in the last 72 hours. Thyroid function studies:  Recent Labs  07/29/15 1254  TSH 4.756*   Anemia work up: No results for input(s): VITAMINB12, FOLATE, FERRITIN, TIBC, IRON, RETICCTPCT in the last 72 hours. Sepsis Labs:  Recent Labs Lab 07/29/15 0900 07/29/15 0924 07/30/15 0443  WBC 10.5  --  9.4  LATICACIDVEN  --  2.15*  --    Microbiology Recent Results (from the past 240 hour(s))  MRSA PCR Screening     Status: None   Collection Time: 07/29/15  1:57 PM  Result Value Ref Range Status   MRSA by PCR NEGATIVE NEGATIVE Final    Comment:        The GeneXpert  MRSA Assay (FDA approved for NASAL specimens only), is one component of a comprehensive MRSA colonization surveillance program. It is not intended to diagnose MRSA infection nor to guide or monitor treatment for MRSA infections.      Medications:   . pantoprazole (PROTONIX) IV  40 mg Intravenous Q12H  . sodium  chloride  500 mL Intravenous Once  . sodium chloride  3 mL Intravenous Q12H   Continuous Infusions: . sodium chloride      Time spent: 25 min   LOS: 1 day   Charlynne Cousins  [facility] Pager [phone]  *Please refer to [email or web address], password [email or web address] to get updated schedule on who will round on this patient, as hospitalists switch teams weekly. If 7PM-7AM, please contact night-coverage at [email or web address], password [email or web address] for any overnight needs.  07/30/2015, 7:16 AM

## 2015-07-30 NOTE — Progress Notes (Signed)
   Patient Name: GYLA POMAR Date of Encounter: 07/30/2015, 12:44 PM    Subjective  Still having hypotension Says she feels very weak if she sits up   Objective  BP 75/40 mmHg  Pulse 73  Temp(Src) 98.4 F (36.9 C) (Oral)  Resp 15  Ht 5\' 1"  (1.549 m)  Wt 111 lb 15.9 oz (50.8 kg)  BMI 21.17 kg/m2  SpO2 100% Frail, elderly Lungs cta ant Cor s1s2 abd soft nt  CBC Latest Ref Rng 07/30/2015 07/30/2015 07/30/2015  WBC 4.0 - 10.5 K/uL 10.2 9.4 -  Hemoglobin 12.0 - 15.0 g/dL 9.4(L) 8.8(L) 9.1(L)  Hematocrit 36.0 - 46.0 % 28.6(L) 26.4(L) 27.5(L)  Platelets 150 - 400 K/uL 239 216 -    Lab Results  Component Value Date   CREATININE 1.18* 07/30/2015   BUN 35* 07/30/2015   NA 136 07/30/2015   K 4.3 07/30/2015   CL 112* 07/30/2015   CO2 18* 07/30/2015     Assessment and Plan  Melena/GI Bleed Acute blood loss anemia DBT on Xarelto - held Hypoalbuminemia Hypotensive  Plan will be more hydration I have ordered 4 doses albumin  Will see if BP will allow egd w/ sedation tomorrow  Gatha Mayer, MD, [facility] [phone] (pager) [phone] after 5 PM, weekends and holidays  07/30/2015 12:46 PM

## 2015-07-30 NOTE — Progress Notes (Signed)
MD paged concerning pt's bp 70's/40-50s. Will continue to monitor.

## 2015-07-30 NOTE — Evaluation (Signed)
Physical Therapy Evaluation Patient Details Name: Lacey Morgan MRN: [id number] DOB: 02-06-1924 Today's Date: 07/30/2015   History of Present Illness  Pt is a 81 y/o F admitted w/ generalized weakness, lightheadedness and has recently been unable to ambulate independently.  Additionally pt having black tarry stools for past few weeks.  Dx: GI bleeding/hemorrhagic shock/acute blood loss anemia.  Pt's PMH includes DVT Rt LE, breast cancer and mastectomy Rt.    Clinical Impression  Pt admitted with above diagnosis. Pt currently with functional limitations due to the deficits listed below (see PT Problem List). Ms. Luick requires mod A for all bed mobility and min A for sit<>stand from bed.  BP as reads below in general comments w/ pt becoming symptomatic upon standing.  Ms. Dahlin is from home and has 24/7 assist from family, but would like to receive [facility] services at d/c if possible. Pt will benefit from skilled PT to increase their independence and safety with mobility to allow discharge to the venue listed below.      Follow Up Recommendations Home health PT;Supervision/Assistance - 24 hour    Equipment Recommendations  None recommended by PT    Recommendations for Other Services       Precautions / Restrictions Precautions Precautions: Fall Precaution Comments: monitor BP Restrictions Weight Bearing Restrictions: No      Mobility  Bed Mobility Overal bed mobility: Needs Assistance Bed Mobility: Rolling;Sidelying to Sit;Sit to Supine Rolling: Mod assist Sidelying to sit: Mod assist   Sit to supine: Mod assist   General bed mobility comments: A to boost up to sitting and A managing Bil LEs when returning to supine.  Use of bed pad to direct hips.  Increased time and cues for technique.  Transfers Overall transfer level: Needs assistance Equipment used: Rolling walker (2 wheeled) Transfers: Sit to/from Stand Sit to Stand: Min assist         General transfer comment:  Cues for technique using RW.  A to power up to standing.  Pt w/ +dizziness upon standing and therefore returned to sitting EOB immediately.    Ambulation/Gait             General Gait Details: did not attempt due to pt's BP low and symptomatic  Stairs            Wheelchair Mobility    Modified Rankin (Stroke Patients Only)       Balance Overall balance assessment: Needs assistance;History of Falls (~3 falls in the past year per pt's family) Sitting-balance support: Bilateral upper extremity supported;Feet supported Sitting balance-Leahy Scale: Fair     Standing balance support: Bilateral upper extremity supported;During functional activity Standing balance-Leahy Scale: Poor Standing balance comment: Relies on RW and A for support                             Pertinent Vitals/Pain Pain Assessment: Faces Faces Pain Scale: Hurts a little bit Pain Location: Lt LE (bruise present just below knee from recent fall) Pain Descriptors / Indicators: Aching;Grimacing;Moaning Pain Intervention(s): Limited activity within patient's tolerance;Monitored during session    Home Living Family/patient expects to be discharged to:: Private residence Living Arrangements: Alone Available Help at Discharge: Family;Available 24 hours/day Type of Home: House Home Access: Stairs to enter Entrance Stairs-Rails: Chemical engineer of Steps: 2 Home Layout: One level Home Equipment: Cane - single point;Walker - 4 wheels;Wheelchair - [employer] (rollator)      Prior Function  Level of Independence: Needs assistance   Gait / Transfers Assistance Needed: Rollator in home, short distances only.  Family reports that she ambulates to bathroom and sometimes does not have strength to ambulate back to bedroom.   ADL's / Homemaking Assistance Needed: Needs A w/ bathing, dressing        Hand Dominance        Extremity/Trunk Assessment   Upper Extremity  Assessment: Generalized weakness           Lower Extremity Assessment: RLE deficits/detail;LLE deficits/detail RLE Deficits / Details: grossly 3/5 strength LLE Deficits / Details: grossly 3/5 strength  Cervical / Trunk Assessment: Kyphotic  Communication   Communication: No difficulties  Cognition Arousal/Alertness: Awake/alert Behavior During Therapy: WFL for tasks assessed/performed Overall Cognitive Status: Impaired/Different from baseline Area of Impairment: Orientation Orientation Level: Disoriented to;Situation;Place             General Comments: reports DOB as 30-Jun-1924, family says this is the first time she's gotten that wrong    General Comments General comments (skin integrity, edema, etc.): BP in supine at start of session: 86/47, sitting EOB: 87/57, supine at end of session 96/58.  When pt reported dizziness in standing she was assisted back to sitting EOB.  Symptoms persisted and pt returned to supine where symptoms quickly dissipated.  Not enough time spent in standing due to pt's symptoms to be able to determine BP.    Exercises General Exercises - Lower Extremity Ankle Circles/Pumps: AROM;Both;10 reps;Supine      Assessment/Plan    PT Assessment Patient needs continued PT services  PT Diagnosis Difficulty walking   PT Problem List Decreased strength;Decreased activity tolerance;Decreased balance;Decreased mobility;Decreased cognition;Decreased knowledge of use of DME;Decreased safety awareness;Decreased knowledge of precautions;Pain  PT Treatment Interventions DME instruction;Gait training;Stair training;Functional mobility training;Therapeutic activities;Therapeutic exercise;Balance training;Neuromuscular re-education;Cognitive remediation;Patient/family education;Wheelchair mobility training   PT Goals (Current goals can be found in the Care Plan section) Acute Rehab PT Goals Patient Stated Goal: to return home w/ 24/7 family assist and [facility] services,  family voices agreement PT Goal Formulation: With patient/family Time For Goal Achievement: 07/16/2015 Potential to Achieve Goals: Good    Frequency Min 3X/week   Barriers to discharge Inaccessible home environment steps to enter home    Co-evaluation               End of Session Equipment Utilized During Treatment: Gait belt Activity Tolerance: Patient tolerated treatment well;Other (comment) (+ dizziness, hypotensive) Patient left: in bed;with call bell/phone within reach;with bed alarm set;with family/visitor present;with SCD's reapplied Nurse Communication: Mobility status;Other (comment) (BP readings)         Time: [id number] PT Time Calculation (min) (ACUTE ONLY): 27 min   Charges:   PT Evaluation $Initial PT Evaluation Tier I: 1 Procedure PT Treatments $Therapeutic Activity: 8-22 mins   PT G Codes:        Joslyn Hy PT, DPT [phone] Pager: [phone] 07/30/2015, 11:54 AM

## 2015-07-30 NOTE — Progress Notes (Signed)
VASCULAR LAB PRELIMINARY  PRELIMINARY  PRELIMINARY  PRELIMINARY  Bilateral lower extremity venous duplex completed.    Preliminary report:  There is non obstructive chronic DVT noted in the right common femoral vein.  All other thrombus noted on study 06/21/15 appear resolved.  There is no DVT noted in the left lower extremity.   Rhyder Bratz, RVT 07/30/2015, 4:51 PM

## 2015-07-30 NOTE — Progress Notes (Addendum)
UR COMPLETED  

## 2015-07-31 ENCOUNTER — Encounter (HOSPITAL_COMMUNITY): Payer: Self-pay | Admitting: *Deleted

## 2015-07-31 ENCOUNTER — Encounter (HOSPITAL_COMMUNITY)
Admission: EM | Disposition: A | Discharge status: Hospice | Payer: Self-pay | Source: Home / Self Care | Attending: Internal Medicine

## 2015-07-31 DIAGNOSIS — K921 Melena: Principal | ICD-10-CM

## 2015-07-31 DIAGNOSIS — K253 Acute gastric ulcer without hemorrhage or perforation: Secondary | ICD-10-CM | POA: Insufficient documentation

## 2015-07-31 DIAGNOSIS — K449 Diaphragmatic hernia without obstruction or gangrene: Secondary | ICD-10-CM | POA: Insufficient documentation

## 2015-07-31 HISTORY — PX: ESOPHAGOGASTRODUODENOSCOPY: SHX5428

## 2015-07-31 LAB — CBC
HEMATOCRIT: 22.8 % — AB (ref 36.0–46.0)
HEMATOCRIT: 23.7 % — AB (ref 36.0–46.0)
HEMOGLOBIN: 7.6 g/dL — AB (ref 12.0–15.0)
HEMOGLOBIN: 7.9 g/dL — AB (ref 12.0–15.0)
MCH: 31.5 pg (ref 26.0–34.0)
MCH: 31.3 pg (ref 26.0–34.0)
MCHC: 33.3 g/dL (ref 30.0–36.0)
MCHC: 33.3 g/dL (ref 30.0–36.0)
MCV: 94.6 fL (ref 78.0–100.0)
MCV: 94 fL (ref 78.0–100.0)
Platelets: 187 10*3/uL (ref 150–400)
Platelets: 190 10*3/uL (ref 150–400)
RBC: 2.41 MIL/uL — AB (ref 3.87–5.11)
RBC: 2.52 MIL/uL — ABNORMAL LOW (ref 3.87–5.11)
RDW: 15.5 % (ref 11.5–15.5)
RDW: 15.6 % — AB (ref 11.5–15.5)
WBC: 7.5 10*3/uL (ref 4.0–10.5)
WBC: 6.3 10*3/uL (ref 4.0–10.5)

## 2015-07-31 LAB — BASIC METABOLIC PANEL
ANION GAP: 6 (ref 5–15)
BUN: 25 mg/dL — ABNORMAL HIGH (ref 6–20)
CALCIUM: 6.8 mg/dL — AB (ref 8.9–10.3)
CO2: 17 mmol/L — AB (ref 22–32)
Chloride: 114 mmol/L — ABNORMAL HIGH (ref 101–111)
Creatinine, Ser: 1.13 mg/dL — ABNORMAL HIGH (ref 0.44–1.00)
GFR calc non Af Amer: 42 mL/min — ABNORMAL LOW (ref >60)
GFR, EST AFRICAN AMERICAN: 48 mL/min — AB (ref >60)
Glucose, Bld: 58 mg/dL — ABNORMAL LOW (ref 65–99)
Potassium: 3.8 mmol/L (ref 3.5–5.1)
Sodium: 137 mmol/L (ref 135–145)

## 2015-07-31 SURGERY — EGD (ESOPHAGOGASTRODUODENOSCOPY)
Anesthesia: Moderate Sedation

## 2015-07-31 MED ORDER — EPINEPHRINE HCL 0.1 MG/ML IJ SOSY
PREFILLED_SYRINGE | INTRAMUSCULAR | Status: AC
  Filled 2015-07-31: qty 10

## 2015-07-31 MED ORDER — SODIUM CHLORIDE 0.9 % IV SOLN
INTRAVENOUS | Status: DC
  Administered 2015-07-31: 75 mL/h via INTRAVENOUS
  Administered 2015-08-01: 01:00:00 via INTRAVENOUS

## 2015-07-31 MED ORDER — FENTANYL CITRATE (PF) 100 MCG/2ML IJ SOLN
INTRAMUSCULAR | Status: AC
  Filled 2015-07-31: qty 2

## 2015-07-31 MED ORDER — MIDAZOLAM HCL 10 MG/2ML IJ SOLN
INTRAMUSCULAR | Status: DC | PRN
  Administered 2015-07-31: 1 mg via INTRAVENOUS

## 2015-07-31 MED ORDER — FENTANYL CITRATE (PF) 100 MCG/2ML IJ SOLN
INTRAMUSCULAR | Status: DC | PRN
  Administered 2015-07-31: 12.5 ug via INTRAVENOUS

## 2015-07-31 MED ORDER — DIPHENHYDRAMINE HCL 50 MG/ML IJ SOLN
INTRAMUSCULAR | Status: AC
  Filled 2015-07-31: qty 1

## 2015-07-31 MED ORDER — MIDAZOLAM HCL 5 MG/ML IJ SOLN
INTRAMUSCULAR | Status: AC
Start: 2015-07-31 — End: 2015-07-31
  Filled 2015-07-31: qty 2

## 2015-07-31 NOTE — Progress Notes (Addendum)
[facility] PROGRESS NOTE    Progress Note   Lacey Morgan PJA:[id number] DOB: 02/14/1924 DOA: 07/29/2015 PCP: Haywood Pao, MD   Brief Narrative:   Lacey Morgan is an 81 y.o. female past medical history of breast cancer in 2001 status post vasectomy, DVT on Xarelto that comes in for weakness and unable to ambulate. Previous EGD and colonoscopy in 2005 and 2006. Her hemoglobin dropped after IV hydration from 11-9. GI was consulted she scheduled for an endoscopy on 07/31/2015.  Assessment/Plan:   Hemorrhagic shock/ GI bleeding/Acute blood loss anemia: Bp improved after IV and albumin. Likely residual from lisinopril which has been held. JVD has resolved CXR clear. Can resume IV fluid Lactic acidosis improved. B- met is pending. Screen 2 units of packed red blood cells continue Protonix. For EGD on 07/31/2015  AKI (acute kidney injury) [facility]): - Improving with hydration likely due to bleeding and antihypertensive medications.  Recent diagnosis of DVT: She was recently diagnosed with a DVT 6 weeks ago.  Xarelto was stopped, lower ext Doppler no DVT's.     DVT Prophylaxis - SCDs  Family Communication: none Disposition Plan: Home in 2-3 days Code Status:     Code Status Orders        Start     Ordered   07/29/15 1225  Do not attempt resuscitation (DNR)   Continuous    Question Answer Comment  In the event of cardiac or respiratory ARREST Do not call a "code blue"   In the event of cardiac or respiratory ARREST Do not perform Intubation, CPR, defibrillation or ACLS   In the event of cardiac or respiratory ARREST Use medication by any route, position, wound care, and other measures to relive pain and suffering. May use oxygen, suction and manual treatment of airway obstruction as needed for comfort.      07/29/15 1224    Advance Directive Documentation        Most Recent Value   Type of Advance Directive  Healthcare Power of Attorney   Pre-existing  out of facility DNR order (yellow form or pink MOST form)     "MOST" Form in Place?          IV Access:    Peripheral IV   Procedures and diagnostic studies:   Dg Chest Port 1 View  08-23-15  CLINICAL DATA:  Dyspnea, history hypertension EXAM: PORTABLE CHEST 1 VIEW COMPARISON:  Portable exam 1952 hours compared to 07/29/2015 FINDINGS: Upper normal heart size. Mediastinal contours and pulmonary vascularity normal. Bibasilar pleural effusions and atelectasis. Central peribronchial thickening. Upper lungs clear. No pneumothorax. Surgical clips RIGHT axilla. Bones demineralized. IMPRESSION: Bronchitic changes with bibasilar pleural effusions and atelectasis minimally increased since previous exam. Electronically Signed   By: Lavonia Dana M.D.   On: 2015-08-23 19:58   Dg Chest Port 1 View  07/29/2015  CLINICAL DATA:  Hypotension EXAM: PORTABLE CHEST 1 VIEW COMPARISON:  None. FINDINGS: There are small pleural effusions bilaterally. There is no edema or consolidation. Heart is upper normal in size with pulmonary vascularity within normal limits. No adenopathy. Bones are osteoporotic. There are surgical clips the right axillary region. Patient has had a previous mastectomy on the right. There old healed rib fractures on the left. IMPRESSION: Small pleural effusions bilaterally. Lungs otherwise clear. Heart upper normal in size. Bones osteoporotic. Electronically Signed   By: Lowella Grip III M.D.   On: 07/29/2015 08:55     Medical Consultants:    None.  Anti-Infectives:   Anti-infectives    None      Subjective:    Lacey Morgan no complaints. Wants to go home  Objective:    Filed Vitals:   07/30/15 1830 07/30/15 2029 07/31/15 0008 07/31/15 0421  BP: 93/53 91/54 103/69 100/59  Pulse: 94 109 105 107  Temp:  97.9 F (36.6 C) 98.1 F (36.7 C) 98.5 F (36.9 C)  TempSrc:  Oral Oral Oral  Resp: '19 21 21 21  '$ Height:      Weight:      SpO2: 100% 100% 99% 100%     Intake/Output Summary (Last 24 hours) at 07/31/15 0717 Last data filed at 07/30/15 1900  Gross per 24 hour  Intake    390 ml  Output      0 ml  Net    390 ml   Filed Weights   07/29/15 1345  Weight: 50.8 kg (111 lb 15.9 oz)    Exam: Gen:  NAD Cardiovascular:  RRR. Chest and lungs:   CTAB Abdomen:  Abdomen soft, NT/ND, + BS Extremities:  No lower extremity edema.   Data Reviewed:    Labs: Basic Metabolic Panel:  Recent Labs Lab 07/29/15 0900 07/29/15 0923 07/30/15 0443  NA 134* 136 136  K 4.7 4.7 4.3  CL 107 103 112*  CO2 22  --  18*  GLUCOSE 72 66 45*  BUN 37* 36* 35*  CREATININE 1.26* 1.30* 1.18*  CALCIUM 7.9*  --  7.1*   GFR Estimated Creatinine Clearance: 24.4 mL/min (by C-G formula based on Cr of 1.18). Liver Function Tests:  Recent Labs Lab 07/29/15 0900  AST 36  ALT 32  ALKPHOS 101  BILITOT 0.5  PROT 5.0*  ALBUMIN 1.4*   No results for input(s): LIPASE, AMYLASE in the last 168 hours. No results for input(s): AMMONIA in the last 168 hours. Coagulation profile  Recent Labs Lab 07/29/15 0900  INR 1.68*    CBC:  Recent Labs Lab 07/29/15 0900  07/29/15 1254 07/30/15 0003 07/30/15 0443 07/30/15 0900 07/30/15 1916  WBC 10.5  --   --   --  9.4 10.2 7.5  NEUTROABS 8.6*  --   --   --   --   --   --   HGB 10.3*  < > 9.2* 9.1* 8.8* 9.4* 8.3*  HCT 30.6*  < > 28.0* 27.5* 26.4* 28.6* 24.1*  MCV 95.9  --   --   --  95.7 96.0 93.4  PLT 250  --   --   --  216 239 176  < > = values in this interval not displayed. Cardiac Enzymes: No results for input(s): CKTOTAL, CKMB, CKMBINDEX, TROPONINI in the last 168 hours. BNP (last 3 results) No results for input(s): PROBNP in the last 8760 hours. CBG: No results for input(s): GLUCAP in the last 168 hours. D-Dimer: No results for input(s): DDIMER in the last 72 hours. Hgb A1c: No results for input(s): HGBA1C in the last 72 hours. Lipid Profile: No results for input(s): CHOL, HDL, LDLCALC,  TRIG, CHOLHDL, LDLDIRECT in the last 72 hours. Thyroid function studies:  Recent Labs  07/29/15 1254  TSH 4.756*   Anemia work up: No results for input(s): VITAMINB12, FOLATE, FERRITIN, TIBC, IRON, RETICCTPCT in the last 72 hours. Sepsis Labs:  Recent Labs Lab 07/29/15 0900 07/29/15 0924 07/30/15 0443 07/30/15 0900 07/30/15 1916  WBC 10.5  --  9.4 10.2 7.5  LATICACIDVEN  --  2.15*  --  1.1  --  Microbiology Recent Results (from the past 240 hour(s))  Culture, blood (Routine X 2) w Reflex to ID Panel     Status: None (Preliminary result)   Collection Time: 07/29/15  8:30 AM  Result Value Ref Range Status   Specimen Description BLOOD LEFT ANTECUBITAL  Final   Special Requests BOTTLES DRAWN AEROBIC AND ANAEROBIC 5CC  Final   Culture NO GROWTH 1 DAY  Final   Report Status PENDING  Incomplete  Culture, blood (Routine X 2) w Reflex to ID Panel     Status: None (Preliminary result)   Collection Time: 07/29/15  9:00 AM  Result Value Ref Range Status   Specimen Description BLOOD LEFT ANTECUBITAL  Final   Special Requests BOTTLES DRAWN AEROBIC AND ANAEROBIC 5CC  Final   Culture NO GROWTH 1 DAY  Final   Report Status PENDING  Incomplete  MRSA PCR Screening     Status: None   Collection Time: 07/29/15  1:57 PM  Result Value Ref Range Status   MRSA by PCR NEGATIVE NEGATIVE Final    Comment:        The GeneXpert MRSA Assay (FDA approved for NASAL specimens only), is one component of a comprehensive MRSA colonization surveillance program. It is not intended to diagnose MRSA infection nor to guide or monitor treatment for MRSA infections.      Medications:   . metoprolol  50 mg Oral Daily  . pantoprazole (PROTONIX) IV  40 mg Intravenous Q12H  . sodium chloride  3 mL Intravenous Q12H   Continuous Infusions:    Time spent: 15 min   LOS: 2 days   Charlynne Cousins  [facility] Pager [phone]  *Please refer to [email or web address], password [email or web address] to get updated  schedule on who will round on this patient, as hospitalists switch teams weekly. If 7PM-7AM, please contact night-coverage at [email or web address], password [email or web address] for any overnight needs.  07/31/2015, 7:17 AM

## 2015-07-31 NOTE — Care Management Note (Addendum)
Case Management Note  Patient Details  Name: ANDRIETTE BUCHMANN MRN: [id number] Date of Birth: [birth year]  Subjective/Objective:              81 y.o. female past medical history of breast cancer in 2001 status post masectomy, DVT on Xarelto that comes in for weakness and unable to ambulate.Lives alone. Uses walker intermittently @ home for ambulation.   Action/Plan:  Return to home when medically stable with home health services. CM to f/u with d/c disposition needs.  Expected Discharge Date:                  Expected Discharge Plan:  [place]  In-House Referral:     Discharge planning Services  CM Consult  Post Acute Care Choice:    Choice offered to:     DME Arranged:    DME Agency:     HH Arranged:    [place] Agency:     Status of Service:  In process, will continue to follow  Medicare Important Message Given:    Date Medicare IM Given:    Medicare IM give by:    Date Additional Medicare IM Given:    Additional Medicare Important Message give by:     If discussed at [place] of Stay Meetings, dates discussed:    Additional Comments: Ralene Bathe Bonnell(son) [phone], Faythe Dingwall (Daughter)  [phone]  [address] [place], [place] [phone] 07/31/2015, 7:57 PM

## 2015-07-31 NOTE — Progress Notes (Signed)
Per patient request, attempted to call family to ask them to come stay with her. No answer. Communicated such to patient.

## 2015-07-31 NOTE — Progress Notes (Signed)
MD notified IV team unable to get two PIV's for EGD. Will continue to monitor.

## 2015-07-31 NOTE — Op Note (Signed)
[facility] [place], [id number]   ENDOSCOPY PROCEDURE REPORT  PATIENT: Lacey Morgan, Lacey Morgan  MR#: [id number] BIRTHDATE: [birth year] , 37  yrs. old GENDER: female ENDOSCOPIST: Gatha Mayer, MD, [facility] PROCEDURE DATE:  07/31/2015 PROCEDURE:  EGD, diagnostic ASA CLASS:     Class III INDICATIONS:  diagnostic procedure and melena. MEDICATIONS: Versed 1 mg IV and Fentanyl 12.5 mcg IV TOPICAL ANESTHETIC: Cetacaine Spray  DESCRIPTION OF PROCEDURE: After the risks benefits and alternatives of the procedure were thoroughly explained, informed consent was obtained.  The Pentax Gastroscope [id number] endoscope was introduced through the mouth and advanced to the second portion of the duodenum , Without limitations.  The instrument was slowly withdrawn as the mucosa was fully examined.    1) 9 cm hiatal hernia with at least 2 cameron erosions seen. 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  Retroflexed views revealed as previously described. The scope was then withdrawn from the patient and the procedure completed.  COMPLICATIONS: There were no immediate complications.  ENDOSCOPIC IMPRESSION: 1) 9 cm hiatal hernia with at least 2 cameron erosions seen. 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  options include pursue colonoscopy - probably not per discussions w/ family, ? IVC filter and no anticoag tx, retry Xarelto vs other anti-coag. Dr.  Benson Norway or Collene Mares will f/u tomorrow.   eSigned:  Gatha Mayer, MD, [facility] 07/31/2015 1:19 PM

## 2015-08-01 ENCOUNTER — Encounter (HOSPITAL_COMMUNITY): Payer: Self-pay | Admitting: Internal Medicine

## 2015-08-01 LAB — BASIC METABOLIC PANEL
ANION GAP: 11 (ref 5–15)
BUN: 24 mg/dL — ABNORMAL HIGH (ref 6–20)
CO2: 14 mmol/L — AB (ref 22–32)
Calcium: 6.6 mg/dL — ABNORMAL LOW (ref 8.9–10.3)
Chloride: 114 mmol/L — ABNORMAL HIGH (ref 101–111)
Creatinine, Ser: 1.03 mg/dL — ABNORMAL HIGH (ref 0.44–1.00)
GFR calc Af Amer: 54 mL/min — ABNORMAL LOW (ref >60)
GFR, EST NON AFRICAN AMERICAN: 47 mL/min — AB (ref >60)
GLUCOSE: 38 mg/dL — AB (ref 65–99)
POTASSIUM: 3.5 mmol/L (ref 3.5–5.1)
Sodium: 139 mmol/L (ref 135–145)

## 2015-08-01 LAB — GLUCOSE, CAPILLARY
GLUCOSE-CAPILLARY: 119 mg/dL — AB (ref 65–99)
Glucose-Capillary: 31 mg/dL — CL (ref 65–99)

## 2015-08-01 LAB — CBC
HEMATOCRIT: 24.3 % — AB (ref 36.0–46.0)
HEMOGLOBIN: 8.1 g/dL — AB (ref 12.0–15.0)
MCH: 31.9 pg (ref 26.0–34.0)
MCHC: 33.3 g/dL (ref 30.0–36.0)
MCV: 95.7 fL (ref 78.0–100.0)
Platelets: 200 10*3/uL (ref 150–400)
RBC: 2.54 MIL/uL — ABNORMAL LOW (ref 3.87–5.11)
RDW: 15.9 % — ABNORMAL HIGH (ref 11.5–15.5)
WBC: 6.2 10*3/uL (ref 4.0–10.5)

## 2015-08-01 MED ORDER — DEXTROSE-NACL 5-0.45 % IV SOLN
INTRAVENOUS | Status: DC
  Administered 2015-08-01: 50 mL/h via INTRAVENOUS

## 2015-08-01 MED ORDER — DEXTROSE 50 % IV SOLN
INTRAVENOUS | Status: AC
  Administered 2015-08-01: 25 mL
  Filled 2015-08-01: qty 50

## 2015-08-01 MED ORDER — QUETIAPINE FUMARATE 25 MG PO TABS
25.0000 mg | ORAL_TABLET | Freq: Every day | ORAL | Status: DC
  Administered 2015-08-01 – 2015-08-02 (×2): 25 mg via ORAL
  Filled 2015-08-01 (×2): qty 1

## 2015-08-01 NOTE — Progress Notes (Addendum)
[facility] PROGRESS NOTE    Progress Note   Lacey Morgan [id number] DOB: 1924-02-23 DOA: 07/29/2015 PCP: Haywood Pao, MD   Brief Narrative:   Lacey Morgan is an 81 y.o. female past medical history of breast cancer in 2001 status post vasectomy, DVT on Xarelto that comes in for weakness and unable to ambulate. Previous EGD and colonoscopy in 2005 and 2006. Her hemoglobin dropped after IV hydration from 11-9. GI was consulted she scheduled for an endoscopy on 07/31/2015.  Assessment/Plan:   Hemorrhagic shock/ GI bleeding/Acute blood loss anemia: Hypotension ? Artifact, check orthostatics. High chloride low HCO, change IV fluids to D5 0.45% Continue Protonix. EGD on 07/31/2015 cameron lesion, she is on Protonix, family refused colonoscopy. They will like to think about a a IVC filter. Not a candidate for anticoagulation. Transfer to telemetry.  AKI (acute kidney injury) ([place]): - Resolved with IV hydration.  Recent diagnosis of DVT: She was recently diagnosed with a DVT 6 weeks ago.  Xarelto was stopped, lower ext Doppler no DVT's, chronic on the common femoral vein.  Mild dementia: Start seroquel at night.  Hypoglycemia: Cont to monitor CBG. Cont D5.      DVT Prophylaxis - SCDs  Family Communication: none Disposition Plan: Home in 1-2 days Code Status:     Code Status Orders        Start     Ordered   07/29/15 1225  Do not attempt resuscitation (DNR)   Continuous    Question Answer Comment  In the event of cardiac or respiratory ARREST Do not call a "code blue"   In the event of cardiac or respiratory ARREST Do not perform Intubation, CPR, defibrillation or ACLS   In the event of cardiac or respiratory ARREST Use medication by any route, position, wound care, and other measures to relive pain and suffering. May use oxygen, suction and manual treatment of airway obstruction as needed for comfort.      07/29/15 1224    Advance  Directive Documentation        Most Recent Value   Type of Advance Directive  Healthcare Power of Attorney   Pre-existing out of facility DNR order (yellow form or pink MOST form)     "MOST" Form in Place?          IV Access:    Peripheral IV   Procedures and diagnostic studies:   Dg Chest Port 1 View  08/02/2015  CLINICAL DATA:  Dyspnea, history hypertension EXAM: PORTABLE CHEST 1 VIEW COMPARISON:  Portable exam 1952 hours compared to 07/29/2015 FINDINGS: Upper normal heart size. Mediastinal contours and pulmonary vascularity normal. Bibasilar pleural effusions and atelectasis. Central peribronchial thickening. Upper lungs clear. No pneumothorax. Surgical clips RIGHT axilla. Bones demineralized. IMPRESSION: Bronchitic changes with bibasilar pleural effusions and atelectasis minimally increased since previous exam. Electronically Signed   By: Lavonia Dana M.D.   On: 2015-08-02 19:58     Medical Consultants:    None.  Anti-Infectives:   Anti-infectives    None      Subjective:    Lacey Morgan Hallucinating at night.  Objective:    Filed Vitals:   08/01/15 0403 08/01/15 0500 08/01/15 0600 08/01/15 0700  BP:  79/62 90/62 75/48   Pulse:  68 69 58  Temp: 98 F (36.7 C)     TempSrc: Oral     Resp:  18 18 15   Height:      Weight:      SpO2:  99% 99% 99%   No intake or output data in the 24 hours ending 08/01/15 0718 Filed Weights   07/29/15 1345  Weight: 50.8 kg (111 lb 15.9 oz)    Exam: Gen:  NAD Cardiovascular:  RRR.-JVD Chest and lungs:   CTAB Abdomen:  Abdomen soft, NT/ND, + BS Extremities:  No lower extremity edema.   Data Reviewed:    Labs: Basic Metabolic Panel:  Recent Labs Lab 07/29/15 0900 07/29/15 0923 07/30/15 0443 07/31/15 0750 08/01/15 0330  NA 134* 136 136 137 139  K 4.7 4.7 4.3 3.8 3.5  CL 107 103 112* 114* 114*  CO2 22  --  18* 17* 14*  GLUCOSE 72 66 45* 58* 38*  BUN 37* 36* 35* 25* 24*  CREATININE 1.26* 1.30* 1.18*  1.13* 1.03*  CALCIUM 7.9*  --  7.1* 6.8* 6.6*   GFR Estimated Creatinine Clearance: 27.9 mL/min (by C-G formula based on Cr of 1.03). Liver Function Tests:  Recent Labs Lab 07/29/15 0900  AST 36  ALT 32  ALKPHOS 101  BILITOT 0.5  PROT 5.0*  ALBUMIN 1.4*   No results for input(s): LIPASE, AMYLASE in the last 168 hours. No results for input(s): AMMONIA in the last 168 hours. Coagulation profile  Recent Labs Lab 07/29/15 0900  INR 1.68*    CBC:  Recent Labs Lab 07/29/15 0900  07/30/15 0443 07/30/15 0900 07/30/15 1916 07/31/15 0700 07/31/15 1835  WBC 10.5  --  9.4 10.2 7.5 7.5 6.3  NEUTROABS 8.6*  --   --   --   --   --   --   HGB 10.3*  < > 8.8* 9.4* 8.3* 7.6* 7.9*  HCT 30.6*  < > 26.4* 28.6* 24.1* 22.8* 23.7*  MCV 95.9  --  95.7 96.0 93.4 94.6 94.0  PLT 250  --  216 239 176 187 190  < > = values in this interval not displayed. Cardiac Enzymes: No results for input(s): CKTOTAL, CKMB, CKMBINDEX, TROPONINI in the last 168 hours. BNP (last 3 results) No results for input(s): PROBNP in the last 8760 hours. CBG:  Recent Labs Lab 08/01/15 0519 08/01/15 0537  GLUCAP 31* 119*   D-Dimer: No results for input(s): DDIMER in the last 72 hours. Hgb A1c: No results for input(s): HGBA1C in the last 72 hours. Lipid Profile: No results for input(s): CHOL, HDL, LDLCALC, TRIG, CHOLHDL, LDLDIRECT in the last 72 hours. Thyroid function studies:  Recent Labs  07/29/15 1254  TSH 4.756*   Anemia work up: No results for input(s): VITAMINB12, FOLATE, FERRITIN, TIBC, IRON, RETICCTPCT in the last 72 hours. Sepsis Labs:  Recent Labs Lab 07/29/15 0924  07/30/15 0900 07/30/15 1916 07/31/15 0700 07/31/15 1835  WBC  --   < > 10.2 7.5 7.5 6.3  LATICACIDVEN 2.15*  --  1.1  --   --   --   < > = values in this interval not displayed. Microbiology Recent Results (from the past 240 hour(s))  Culture, blood (Routine X 2) w Reflex to ID Panel     Status: None (Preliminary  result)   Collection Time: 07/29/15  8:30 AM  Result Value Ref Range Status   Specimen Description BLOOD LEFT ANTECUBITAL  Final   Special Requests BOTTLES DRAWN AEROBIC AND ANAEROBIC 5CC  Final   Culture NO GROWTH 2 DAYS  Final   Report Status PENDING  Incomplete  Culture, blood (Routine X 2) w Reflex to ID Panel     Status: None (Preliminary result)  Collection Time: 07/29/15  9:00 AM  Result Value Ref Range Status   Specimen Description BLOOD LEFT ANTECUBITAL  Final   Special Requests BOTTLES DRAWN AEROBIC AND ANAEROBIC 5CC  Final   Culture NO GROWTH 2 DAYS  Final   Report Status PENDING  Incomplete  MRSA PCR Screening     Status: None   Collection Time: 07/29/15  1:57 PM  Result Value Ref Range Status   MRSA by PCR NEGATIVE NEGATIVE Final    Comment:        The GeneXpert MRSA Assay (FDA approved for NASAL specimens only), is one component of a comprehensive MRSA colonization surveillance program. It is not intended to diagnose MRSA infection nor to guide or monitor treatment for MRSA infections.      Medications:   . metoprolol  50 mg Oral Daily  . pantoprazole (PROTONIX) IV  40 mg Intravenous Q12H  . sodium chloride  3 mL Intravenous Q12H   Continuous Infusions: . dextrose 5 % and 0.45% NaCl      Time spent: 15 min   LOS: 3 days   Charlynne Cousins  [facility] Pager [phone]  *Please refer to [email or web address], password [email or web address] to get updated schedule on who will round on this patient, as hospitalists switch teams weekly. If 7PM-7AM, please contact night-coverage at [email or web address], password [email or web address] for any overnight needs.  08/01/2015, 7:18 AM

## 2015-08-01 NOTE — Care Management Important Message (Signed)
Important Message  Patient Details  Name: Lacey Morgan MRN: [id number] Date of Birth: 04/24/1924   Medicare Important Message Given:  Yes    Keevin Panebianco Abena 08/01/2015, 1:04 PM

## 2015-08-01 NOTE — Evaluation (Signed)
Occupational Therapy Evaluation Patient Details Name: Lacey Morgan MRN: [id number] DOB: May 18, 1924 Today's Date: 08/01/2015    History of Present Illness Pt is a 81 y/o F admitted w/ generalized weakness, lightheadedness and has recently been unable to ambulate independently.  Additionally pt having black tarry stools for past few weeks.  Dx: GI bleeding/hemorrhagic shock/acute blood loss anemia.  Pt's PMH includes DVT Rt LE, breast cancer and mastectomy Rt.     Clinical Impression   Pt was needing greater assist for self care and mobility in the weeks prior to admission.  Her family was providing nearly 24 hour care. Pt presents with generalized weakness, poor activity tolerance and impaired balance interfering with ability to perform ADL and ADL transfers.  Pt will benefit from SNF level rehab prior to return home and family is in agreement.  Will follow acutely.    Follow Up Recommendations  SNF;Supervision/Assistance - 24 hour    Equipment Recommendations       Recommendations for Other Services       Precautions / Restrictions Precautions Precautions: Fall Precaution Comments: monitor BP Restrictions Weight Bearing Restrictions: No      Mobility Bed Mobility               General bed mobility comments: pt in chair  Transfers Overall transfer level: Needs assistance   Transfers: Sit to/from Stand;Stand Pivot Transfers Sit to Stand: Mod assist Stand pivot transfers: Mod assist       General transfer comment: assist to rise and for balance, pt fearful of falling, cues for hand placement    Balance     Sitting balance-Leahy Scale: Fair       Standing balance-Leahy Scale: Poor                              ADL Overall ADL's : Needs assistance/impaired Eating/Feeding: Set up;Sitting   Grooming: Minimal assistance;Sitting   Upper Body Bathing: Minimal assitance;Sitting   Lower Body Bathing: Total assistance;Sit to/from stand    Upper Body Dressing : Minimal assistance;Sitting   Lower Body Dressing: Total assistance;Sit to/from stand   Toilet Transfer: Moderate assistance;Stand-pivot;BSC   Toileting- Clothing Manipulation and Hygiene: Total assistance;Sit to/from stand Toileting - Clothing Manipulation Details (indicate cue type and reason): pt incontinent of bowel             Vision     Perception     Praxis      Pertinent Vitals/Pain Pain Assessment: No/denies pain     Hand Dominance Right   Extremity/Trunk Assessment Upper Extremity Assessment Upper Extremity Assessment: Generalized weakness   Lower Extremity Assessment Lower Extremity Assessment: Defer to PT evaluation   Cervical / Trunk Assessment Cervical / Trunk Assessment: Kyphotic   Communication Communication Communication: No difficulties   Cognition Arousal/Alertness: Awake/alert Behavior During Therapy: Flat affect Overall Cognitive Status: Within Functional Limits for tasks assessed Area of Impairment: Problem solving             Problem Solving: Slow processing;Decreased initiation;Requires verbal cues General Comments: pt does not always give complete hx of PLOF, family filling in gaps   General Comments       Exercises       Shoulder Instructions      Home Living Family/patient expects to be discharged to:: Private residence Living Arrangements: Alone Available Help at Discharge: Family;Available 24 hours/day Type of Home: House Home Access: Stairs to enter [employer] of Steps: 2  Entrance Stairs-Rails: Left;Right Home Layout: One level     Bathroom Shower/Tub: Teacher, early years/pre: Standard     Home Equipment: Cane - single point;Walker - 4 wheels;Wheelchair - [employer];Hospital bed          Prior Functioning/Environment Level of Independence: Needs assistance  Gait / Transfers Assistance Needed: Rollator in home, short distances only.  Family reports  that she ambulates to bathroom and sometimes does not have strength to ambulate back to bedroom.  ADL's / Homemaking Assistance Needed: sponge bathes with assist for LB, assist for LB dressing, performs pericare, but not thoroughly, self feeds, family was performing IADL        OT Diagnosis: Generalized weakness;Cognitive deficits   OT Problem List: Decreased strength;Decreased activity tolerance;Impaired balance (sitting and/or standing);Decreased cognition;Decreased knowledge of use of DME or AE   OT Treatment/Interventions: Self-care/ADL training;DME and/or AE instruction;Patient/family education;Balance training;Therapeutic activities;Therapeutic exercise    OT Goals(Current goals can be found in the care plan section) Acute Rehab OT Goals Patient Stated Goal: family wants pt to be stronger before returning home OT Goal Formulation: With patient Time For Goal Achievement: 08/15/15 Potential to Achieve Goals: Fair  OT Frequency: Min 2X/week   Barriers to D/C:            Co-evaluation              End of Session    Activity Tolerance: Patient limited by fatigue Patient left: in chair;with call bell/phone within reach;with family/visitor present   Time: 0905-0929 OT Time Calculation (min): 24 min Charges:  OT General Charges $OT Visit: 1 Procedure OT Evaluation $Initial OT Evaluation Tier I: 1 Procedure OT Treatments $Self Care/Home Management : 8-22 mins G-Codes:    [name] 08/01/2015, 10:58 AM [phone]

## 2015-08-01 NOTE — Progress Notes (Signed)
UR COMPLETED  

## 2015-08-01 NOTE — Progress Notes (Signed)
Notified by family and pt that they do not want to go forward with a colonoscopy. MD Collene Mares notified. Family at bedside. Will continue to monitor patient.

## 2015-08-01 NOTE — Progress Notes (Signed)
Subjective: Chart reviewed. I have had an extensive discussion with the patient's son and her 3 daughters about the patient's frail status. She is not able to prep for a colonoscopy and the family feels that would be very tough on her. The patient herself who is mentally clear does not want it done. She has another son who is on his way to the hospital. The family plans to make some decisions together. She continues to have melenic stools. Results of the EGD noted.   Objective: Vital signs in last 24 hours: Temp:  [97.3 F (36.3 C)-98.1 F (36.7 C)] 97.9 F (36.6 C) (12/19 1603) Pulse Rate:  [58-117] 75 (12/19 1150) Resp:  [14-24] 20 (12/19 1150) BP: (75-108)/(48-85) 100/58 mmHg (12/19 1150) SpO2:  [93 %-100 %] 93 % (12/19 1150) Last BM Date: 07/29/15  Intake/Output from previous day:   Intake/Output this shift:   General appearance: alert, cooperative, appears stated age, no distress and pale Resp: clear to auscultation bilaterally Cardio: regular rate and rhythm, S1, S2 normal, no murmur, click, rub or gallop GI: soft, non-tender; bowel sounds normal; no masses,  no organomegaly  Lab Results:  Recent Labs  07/31/15 0700 07/31/15 1835 08/01/15 0723  WBC 7.5 6.3 6.2  HGB 7.6* 7.9* 8.1*  HCT 22.8* 23.7* 24.3*  PLT 187 190 200   BMET  Recent Labs  07/30/15 0443 07/31/15 0750 08/01/15 0330  NA 136 137 139  K 4.3 3.8 3.5  CL 112* 114* 114*  CO2 18* 17* 14*  GLUCOSE 45* 58* 38*  BUN 35* 25* 24*  CREATININE 1.18* 1.13* 1.03*  CALCIUM 7.1* 6.8* 6.6*   Studies/Results: Dg Chest Port 1 View  07/30/2015  CLINICAL DATA:  Dyspnea, history hypertension EXAM: PORTABLE CHEST 1 VIEW COMPARISON:  Portable exam 1952 hours compared to 07/29/2015 FINDINGS: Upper normal heart size. Mediastinal contours and pulmonary vascularity normal. Bibasilar pleural effusions and atelectasis. Central peribronchial thickening. Upper lungs clear. No pneumothorax. Surgical clips RIGHT axilla. Bones  demineralized. IMPRESSION: Bronchitic changes with bibasilar pleural effusions and atelectasis minimally increased since previous exam. Electronically Signed   By: Lavonia Dana M.D.   On: 07/30/2015 19:58   Medications: I have reviewed the patient's current medications.  Assessment/Plan: 1) Anemia with melena-large hiatal hernia with [name] lesions noted on the EGD-on Protonix; will monitor serial CBC's and hold off on a colonoscopy till the family decided what they would like done for her. 2) AKI-BUN/Creatinine 24/1.03  3) History of left leg DVT. 4) ?Dementia and history of hallucinations.  5) DNR.  LOS: 3 days   Monterius Rolf 08/01/2015, 5:28 PM

## 2015-08-01 NOTE — Progress Notes (Signed)
MD notified of pt's continued frequent loose, black tarry stools. No new orders. Will continue to monitor.

## 2015-08-01 NOTE — Progress Notes (Signed)
CRITICAL VALUE ALERT  Critical value received:  CBG 31  Date of notification: 08/01/2015   Time of notification:  0516  Critical value read back: no  Nurse who received alert:  Awilda Metro RN  MD notified (1st page):  N/a    Time of first page:  n/a  MD notified (2nd page):n/a  Time of second page:n/a  Responding MD:  n/a  Time MD responded:  n/a

## 2015-08-01 NOTE — Progress Notes (Signed)
Physical Therapy Treatment Patient Details Name: Lacey Morgan MRN: [id number] DOB: 23-Nov-1923 Today's Date: 08/01/2015    History of Present Illness Pt is a 81 y/o F admitted w/ generalized weakness, lightheadedness and has recently been unable to ambulate independently.  Additionally pt having black tarry stools for past few weeks.  Dx: GI bleeding/hemorrhagic shock/acute blood loss anemia.  Pt's PMH includes DVT Rt LE, breast cancer and mastectomy Rt.      PT Comments    Ms. Prew expressed and demonstrated increased fatigue and poor activity tolerance today.  Spoke w/ pt's son who was unsure if family has decided on home or SNF at d/c.  Given pt's decline in activity tolerance and need for increased assist d/c plans have been updated to SNF.  Pt currently requiring mod assist to boost up to stand and fatigues quickly.  Pt will benefit from continued skilled PT services to increase functional independence and safety.   Follow Up Recommendations  Home health PT;Supervision/Assistance - 24 hour;SNF     Equipment Recommendations  None recommended by PT    Recommendations for Other Services       Precautions / Restrictions Precautions Precautions: Fall Precaution Comments: monitor BP Restrictions Weight Bearing Restrictions: No    Mobility  Bed Mobility Overal bed mobility: Needs Assistance Bed Mobility: Sit to Supine;Supine to Sit     Supine to sit: Mod assist Sit to supine: Mod assist   General bed mobility comments: A using bed pad for positioning and directing movement.  Pt able to push up w/ increased time and A.  Dyspnea noted 2/4.  A managing Bil LEs to return to supine in bed.  Transfers Overall transfer level: Needs assistance Equipment used: Rolling walker (2 wheeled) Transfers: Sit to/from Stand Sit to Stand: Mod assist         General transfer comment: sit<>stand from bed x 3.  Mod A to boost from standing.  Pt w/ initial posterior lean and fear of  falling but stability improves to only requiring min assist after cues for anterior weight shift.  Ambulation/Gait             General Gait Details: unable to attempt   Stairs            Wheelchair Mobility    Modified Rankin (Stroke Patients Only)       Balance Overall balance assessment: Needs assistance Sitting-balance support: Feet supported Sitting balance-Leahy Scale: Fair     Standing balance support: Bilateral upper extremity supported;During functional activity Standing balance-Leahy Scale: Poor                      Cognition Arousal/Alertness: Awake/alert Behavior During Therapy: WFL for tasks assessed/performed Overall Cognitive Status: Impaired/Different from baseline Area of Impairment: Orientation;Problem solving Orientation Level: Disoriented to;Situation;Place           Problem Solving: Slow processing;Decreased initiation General Comments: Thinks she is in a trailer even after cues to look around room.  Very surprised when told she is in the hospital    Exercises General Exercises - Lower Extremity Ankle Circles/Pumps: AROM;Both;10 reps;Supine    General Comments General comments (skin integrity, edema, etc.): BP supine at start of session: 101/60, sitting EOB 100/61.  Pt not symptomatic      Pertinent Vitals/Pain Pain Assessment: Faces Faces Pain Scale: Hurts a little bit Pain Location: allodynia Bil LEs  Pain Descriptors / Indicators: Grimacing (sensitive) Pain Intervention(s): Limited activity within patient's tolerance;Monitored during session  Home Living                      Prior Function            PT Goals (current goals can now be found in the care plan section) Acute Rehab PT Goals Patient Stated Goal: to feel better PT Goal Formulation: With patient/family Time For Goal Achievement: 08/06/2015 Potential to Achieve Goals: Good Progress towards PT goals: Not progressing toward goals - comment  (increased fatigue and generalized weakness)    Frequency  Min 3X/week    PT Plan Discharge plan needs to be updated    Co-evaluation             End of Session Equipment Utilized During Treatment: Gait belt Activity Tolerance: Patient limited by fatigue Patient left: in bed;with call bell/phone within reach;with bed alarm set;with family/visitor present     Time: [id number] PT Time Calculation (min) (ACUTE ONLY): 28 min  Charges:  $Therapeutic Exercise: 8-22 mins $Therapeutic Activity: 8-22 mins                    G Codes:      Joslyn Hy PT, DPT [phone] Pager: [phone] 08/01/2015, 1:21 PM

## 2015-08-02 DIAGNOSIS — Z7189 Other specified counseling: Secondary | ICD-10-CM | POA: Insufficient documentation

## 2015-08-02 DIAGNOSIS — R0600 Dyspnea, unspecified: Secondary | ICD-10-CM

## 2015-08-02 DIAGNOSIS — Z515 Encounter for palliative care: Secondary | ICD-10-CM

## 2015-08-02 LAB — GLUCOSE, CAPILLARY: GLUCOSE-CAPILLARY: 139 mg/dL — AB (ref 65–99)

## 2015-08-02 MED ORDER — SODIUM CHLORIDE 0.9 % IV SOLN: INTRAVENOUS | Status: DC

## 2015-08-02 MED ORDER — BIOTENE DRY MOUTH MT LIQD: 15.0000 mL | OROMUCOSAL | Status: DC | PRN

## 2015-08-02 MED ORDER — GLYCOPYRROLATE 0.2 MG/ML IJ SOLN
0.2000 mg | INTRAMUSCULAR | Status: DC | PRN
  Filled 2015-08-02: qty 1

## 2015-08-02 MED ORDER — HALOPERIDOL LACTATE 5 MG/ML IJ SOLN: 0.5000 mg | INTRAMUSCULAR | Status: DC | PRN

## 2015-08-02 MED ORDER — HALOPERIDOL 1 MG PO TABS
0.5000 mg | ORAL_TABLET | ORAL | Status: DC | PRN
  Filled 2015-08-02: qty 1

## 2015-08-02 MED ORDER — ACETAMINOPHEN 650 MG RE SUPP
650.0000 mg | Freq: Four times a day (QID) | RECTAL | Status: DC | PRN
Start: 2015-08-02 — End: 2015-08-03

## 2015-08-02 MED ORDER — ONDANSETRON HCL 4 MG/2ML IJ SOLN: 4.0000 mg | Freq: Four times a day (QID) | INTRAMUSCULAR | Status: DC | PRN

## 2015-08-02 MED ORDER — POLYVINYL ALCOHOL 1.4 % OP SOLN
1.0000 [drp] | Freq: Four times a day (QID) | OPHTHALMIC | Status: DC | PRN
  Filled 2015-08-02: qty 15

## 2015-08-02 MED ORDER — BISACODYL 10 MG RE SUPP: 10.0000 mg | Freq: Every day | RECTAL | Status: DC | PRN

## 2015-08-02 MED ORDER — MORPHINE SULFATE (PF) 2 MG/ML IV SOLN: 1.0000 mg | INTRAVENOUS | Status: DC | PRN

## 2015-08-02 MED ORDER — HALOPERIDOL LACTATE 2 MG/ML PO CONC
0.5000 mg | ORAL | Status: DC | PRN
  Filled 2015-08-02: qty 0.3

## 2015-08-02 MED ORDER — ONDANSETRON 4 MG PO TBDP
4.0000 mg | ORAL_TABLET | Freq: Four times a day (QID) | ORAL | Status: DC | PRN
  Filled 2015-08-02: qty 1

## 2015-08-02 MED ORDER — GLYCOPYRROLATE 1 MG PO TABS
1.0000 mg | ORAL_TABLET | ORAL | Status: DC | PRN
  Filled 2015-08-02: qty 1

## 2015-08-02 MED ORDER — ACETAMINOPHEN 325 MG PO TABS: 650.0000 mg | ORAL_TABLET | Freq: Four times a day (QID) | ORAL | Status: DC | PRN

## 2015-08-02 NOTE — Progress Notes (Signed)
Patient transferred from  ICU. Patient drowsy but easily aroused. Patient made comfortable. Family at the bed side.

## 2015-08-02 NOTE — Progress Notes (Signed)
MD spoke with CM regarding plan for residential hospice. CM spoke with son(Mike) and daughter Jacqlyn Larsen) about plans for residential hospice. Both sibling are in agreement with plans. CM placed CSW referral. CM to f/u with disposition needs. Whitman Hero RN,BSN,CM [phone]

## 2015-08-02 NOTE — Clinical Social Work Note (Signed)
CSW received consult for hospice placement as patient is now comfort care. CSW attempted to reach son Delfino Lovett "Ronalee Belts" Pauline Good [facility]) at number provided by sister Faythe Dingwall, [phone].  Call made an message left for son to contact CSW regarding discharge disposition decision.   Naif Alabi Givens, MSW, LCSW Licensed Clinical Social Worker [place] [phone]

## 2015-08-02 NOTE — Consult Note (Signed)
Consultation Note Date: 08/02/2015   Patient Name: Lacey Morgan  DOB: [birth year]  MRN: [id number]  Age / Sex: 81 y.o., female  PCP: Haywood Pao, MD Referring Physician: Charlynne Cousins, MD  Reason for Consultation: Establishing goals of care    Clinical Assessment/Narrative:   Lacey Morgan is an 81 y.o. female past medical history of breast cancer in 2001 status post vasectomy, DVT on Xarelto who lives at home by herself. She has several children who live nearby.  She was admitted on 07-29-15 for weakness and inability to ambulate. Previous EGD and colonoscopy in 2005 and 2006. Her hemoglobin dropped after IV hydration from 11-9. GI was consulted she scheduled for an endoscopy on 07/31/2015. EGD with [name] lesions. Dr Collene Mares following. Owing to the patient's frailty and after further discussions with GI, family elected not to proceed with colonoscopy. Hospital course also complicated by acute kidney injury. Patient was diagnosed with DVT 6 weeks ago, not a candidate for further anticoagulation due to GIB. Family elected not to proceed with IVC filter. SNF rehab was being pursued as D/C option, palliative consult placed for further [place] discussions.    The patient is a very frail weak pale elderly lady resting in bed, she has sighing respirations, she appears to be in mild generalized distress with episodic wincing grimacing- non verbal gestures of distress or discomfort.   Discussed extensively with the patient's son Alesia Morin agent and daughter present at the bedside. Life review also performed. The patient is describes as a very strong person, she lives alone. She had 6 children, one son died from ETOH cirrhosis few years ago. Son and daughter present in the room describe that the patient's wishes are for her deceased son's ashes to be included in her casket when she died. She has 5 living children. Son  Lacey Morgan is the HCPOA agent. Patient had never wanted to go to a nursing home.   Discussed about labile BPs and heart rate. Discussed that the patient has possibly ongoing GIB and prognosis might could be hours to days at this point. Family state that they are aware that the patient is actively declining. They do not want any more tests or procedures, they want to keep her comfortable. Discussed about how SNF rehab is not possible at this stage, patient not even awake alert enough to have conversations.   Discussed about hospice facility- son Lacey Morgan is familiar with [facility]. He is agreeable to hospice support, how ever, he wishes to first discuss with his siblings today.   Answered all questions, offered supportive care, active listening. Will discuss with case management. Will complete end of life order set.   Thank you for the consult.        Contacts/Participants in Discussion: Primary Decision Maker: Lacey Morgan Relationship to Patient son HCPOA: yes   5 children who make decisions together, son Lacey Morgan is HCPOA agent.   SUMMARY OF RECOMMENDATIONS: DNR DNI Prognosis appears to be not more than few hours to few days at this point, discussed with son Lacey Morgan and daughter and son in law present in the room Discussed about SNF rehabnot being appropriate disposition option for the patient at this time Discussed about comfort measures and Hospice Discussed about inpatient hospice such as [facility] Son agrees to above mentioned plan but wishes to discuss with his siblings first. He will discuss with them by this afternoon Anticipate SW consult and proceeding with hospice home soon Comfort/ end of life order set will  be completed.   Thank you for the consult.    Code Status/Advance Care Planning: DNR    Code Status Orders        Start     Ordered   07/29/15 1225  Do not attempt resuscitation (DNR)   Continuous    Question Answer Comment  In the event of cardiac or respiratory ARREST Do not  call a "code blue"   In the event of cardiac or respiratory ARREST Do not perform Intubation, CPR, defibrillation or ACLS   In the event of cardiac or respiratory ARREST Use medication by any route, position, wound care, and other measures to relive pain and suffering. May use oxygen, suction and manual treatment of airway obstruction as needed for comfort.      07/29/15 1224    Advance Directive Documentation        Most Recent Value   Type of Advance Directive  Healthcare Power of Attorney   Pre-existing out of facility DNR order (yellow form or pink MOST form)     "MOST" Form in Place?        Other Directives:Other  Symptom Management:    see end of life order set.   Palliative Prophylaxis:   Bowel Regimen  Additional Recommendations (Limitations, Scope, Preferences):  Full Comfort Care     Psycho-social/Spiritual:  Support System: Strong Desire for further Chaplaincy support:yes Additional Recommendations: Education on Hospice  Prognosis: Hours - Days  Discharge Planning: Hospice facility   Chief Complaint/ Primary Diagnoses: Present on Admission:  . Hemorrhagic shock . GI bleeding  I have reviewed the medical record, interviewed the patient and family, and examined the patient. The following aspects are pertinent.  Past Medical History  Diagnosis Date  . Hypertension   . DVT (deep venous thrombosis) (HCC)     right Leg  . Cancer [facility])     Breast   Social History   Social History  . Marital Status: Widowed    Spouse Name: N/A  . Number of Children: N/A  . Years of Education: N/A   Social History Main Topics  . Smoking status: Never Smoker   . Smokeless tobacco: None  . Alcohol Use: No  . Drug Use: No  . Sexual Activity: Not Asked   Other Topics Concern  . None   Social History Narrative   History reviewed. No pertinent family history. Scheduled Meds: . metoprolol  50 mg Oral Daily  . pantoprazole (PROTONIX) IV  40 mg Intravenous Q12H    . QUEtiapine  25 mg Oral QHS  . sodium chloride  3 mL Intravenous Q12H   Continuous Infusions: . dextrose 5 % and 0.45% NaCl 50 mL/hr (08/01/15 1324)   PRN Meds:.Gerhardt's butt cream Medications Prior to Admission:  Prior to Admission medications   Medication Sig Start Date End Date Taking? Authorizing Provider  alendronate (FOSAMAX) 70 MG tablet Take 70 mg by mouth once a week. (Tuesday) Take with a full glass of water on an empty stomach.   Yes Historical Provider, MD  calcium carbonate (OSCAL) 1500 (600 CA) MG TABS tablet Take 600 mg of elemental calcium by mouth 2 (two) times daily with a meal.   Yes Historical Provider, MD  ferrous sulfate 325 (65 FE) MG tablet Take 325 mg by mouth daily with breakfast.   Yes Historical Provider, MD  lisinopril (PRINIVIL,ZESTRIL) 10 MG tablet Take 10 mg by mouth daily.   Yes Historical Provider, MD  metoprolol (LOPRESSOR) 100 MG tablet Take 50 mg  by mouth daily.   Yes Historical Provider, MD  rivaroxaban (XARELTO) 20 MG TABS tablet Take 20 mg by mouth daily with supper.   Yes Historical Provider, MD   No Known Allergies  Review of Systems Does not verbalize  Physical Exam Weak frail elderly lady Looks pale, does not verbalize Shallow breathing Skin warm dry no edema no coolness no mottling Abdomen mild distension  Vital Signs: BP 87/50 mmHg  Pulse 64  Temp(Src) 98 F (36.7 C) (Oral)  Resp 19  Ht 5\' 1"  (1.549 m)  Wt 50.8 kg (111 lb 15.9 oz)  BMI 21.17 kg/m2  SpO2 100%  SpO2: SpO2: 100 % O2 Device:SpO2: 100 % O2 Flow Rate: .O2 Flow Rate (L/min): 2 L/min  IO: Intake/output summary:  Intake/Output Summary (Last 24 hours) at 08/02/15 1018 Last data filed at 08/02/15 0700  Gross per 24 hour  Intake    450 ml  Output      0 ml  Net    450 ml    LBM: Last BM Date: 08/02/15 Baseline Weight: Weight: 50.8 kg (111 lb 15.9 oz) Most recent weight: Weight: 50.8 kg (111 lb 15.9 oz)      Palliative Assessment/Data:  Flowsheet Rows         Most Recent Value   Intake Tab    Referral Department  Hospitalist   Unit at Time of Referral  Cardiac/Telemetry Unit   Palliative Care Primary Diagnosis  Other (Comment)   Date Notified  08/02/15   Palliative Care Type  New Palliative care   Date first seen by Palliative Care  08/02/15   # of days Palliative referral response time  0 Day(s)   Clinical Assessment    Palliative Performance Scale Score  10%   Pain Max last 24 hours  6   Pain Min Last 24 hours  5   Dyspnea Max Last 24 Hours  3   Dyspnea Min Last 24 hours  2   Psychosocial & Spiritual Assessment    Social Work Plan of Care  Education on [place]    Patient/Family meeting held?  Yes   Who was at the meeting?  son daughter    [place] regarding hospice   Palliative Care follow-up planned  Yes, Facility      Additional Data Reviewed:  CBC:    Component Value Date/Time   WBC 6.2 08/01/2015 0723   WBC 9.6 08/28/2006 1050   HGB 8.1* 08/01/2015 0723   HGB 10.7* 08/28/2006 1050   HCT 24.3* 08/01/2015 0723   HCT 32.6* 08/28/2006 1050   PLT 200 08/01/2015 0723   PLT 348 08/28/2006 1050   MCV 95.7 08/01/2015 0723   MCV 82.8 08/28/2006 1050   NEUTROABS 8.6* 07/29/2015 0900   NEUTROABS 5.8 08/28/2006 1050   LYMPHSABS 1.2 07/29/2015 0900   LYMPHSABS 2.7 08/28/2006 1050   MONOABS 0.7 07/29/2015 0900   MONOABS 1.0* 08/28/2006 1050   EOSABS 0.0 07/29/2015 0900   EOSABS 0.1 08/28/2006 1050   BASOSABS 0.0 07/29/2015 0900   BASOSABS 0.0 08/28/2006 1050   Comprehensive Metabolic Panel:    Component Value Date/Time   NA 139 08/01/2015 0330   K 3.5 08/01/2015 0330   CL 114* 08/01/2015 0330   CO2 14* 08/01/2015 0330   BUN 24* 08/01/2015 0330   CREATININE 1.03* 08/01/2015 0330   GLUCOSE 38* 08/01/2015 0330   CALCIUM 6.6* 08/01/2015 0330   AST 36 07/29/2015 0900  ALT 32 07/29/2015 0900   ALKPHOS 101 07/29/2015 0900   BILITOT 0.5 07/29/2015 0900   PROT 5.0*  07/29/2015 0900   ALBUMIN 1.4* 07/29/2015 0900     Time In: 0730 Time Out: 0830 Time Total: 60 min  Greater than 50%  of this time was spent counseling and coordinating care related to the above assessment and plan.  Signed by: Loistine Chance, MD [place], MD  08/02/2015, 10:18 AM  Please contact Palliative Medicine Team phone at [phone] for questions and concerns.

## 2015-08-02 NOTE — Progress Notes (Signed)
   08/02/15 1000  Clinical Encounter Type  Visited With Patient not available;Health care provider  Visit Type Follow-up;Patient actively dying  Referral From Palliative care team  Consult/Referral To Chaplain  Ch attempted to meet with family and pt; pt asleep and family just left. [place] will attempt to follow-up. Gwynn Burly 10:52 AM

## 2015-08-02 NOTE — Clinical Social Work Note (Signed)
Contact made with patient's son Delfino Lovett "Ronalee Belts" [place] [phone]) regarding discharge disposition. He and siblings are in agreement with [facility] as per Ronalee Belts, the family cannot provide care for patient at home.  Mr. Jacobe advised that he will be kept informed and may be contacted by the [place].  Call made to Erling Conte and referral for [facility] made.  Patient is being transferred to 5M05 and CSW for this unit provided with handoff.   Kryssa Risenhoover Givens, MSW, LCSW Licensed Clinical Social Worker [place] [phone]

## 2015-08-02 NOTE — Progress Notes (Signed)
[facility] PROGRESS NOTE    Progress Note   Lacey Morgan [id number] DOB: January 30, 1924 DOA: 07/29/2015 PCP: Haywood Pao, MD   Brief Narrative:   Lacey Morgan is an 81 y.o. female past medical history of breast cancer in 2001 status post vasectomy, DVT on Xarelto that comes in for weakness and unable to ambulate. Previous EGD and colonoscopy in 2005 and 2006. Her hemoglobin dropped after IV hydration from 11-9. GI was consulted she scheduled for an endoscopy on 07/31/2015.  Assessment/Plan:   Hemorrhagic shock/ GI bleeding/Acute blood loss anemia: Orthostatics were negative most likely artifact. Continue Protonix. EGD on 07/31/2015 cameron lesion, she is on Protonix, family refused colonoscopy. Pt and family will not like to pursue colonoscopy. Transfer to telemetry. Pt evaluated the patient recommended SNF  AKI (acute kidney injury) ([place]): - Resolved with IV hydration.  Recent diagnosis of DVT: She was recently diagnosed with a DVT 6 weeks ago. Family will like not to proceed with IVC filter. Is not a candidate for anticoagulation due to GI bleed, they have refused colonoscopy, will consult PMT.  Mild dementia: Start seroquel at night.  Hypoglycemia: No futher events. Cont to monitor CBG. Cont D5.      DVT Prophylaxis - SCDs  Family Communication: none Disposition Plan: Home in in am Code Status:     Code Status Orders        Start     Ordered   07/29/15 1225  Do not attempt resuscitation (DNR)   Continuous    Question Answer Comment  In the event of cardiac or respiratory ARREST Do not call a "code blue"   In the event of cardiac or respiratory ARREST Do not perform Intubation, CPR, defibrillation or ACLS   In the event of cardiac or respiratory ARREST Use medication by any route, position, wound care, and other measures to relive pain and suffering. May use oxygen, suction and manual treatment of airway obstruction as needed for  comfort.      07/29/15 1224    Advance Directive Documentation        Most Recent Value   Type of Advance Directive  Healthcare Power of Attorney   Pre-existing out of facility DNR order (yellow form or pink MOST form)     "MOST" Form in Place?          IV Access:    Peripheral IV   Procedures and diagnostic studies:   No results found.   Medical Consultants:    None.  Anti-Infectives:   Anti-infectives    None      Subjective:    [place] Jury Nicholl Hallucinating at night.  Objective:    Filed Vitals:   08/01/15 2321 08/02/15 0405 08/02/15 0421 08/02/15 0620  BP:  89/61  87/50  Pulse:  69  64  Temp: 97.2 F (36.2 C) 98 F (36.7 C) 98 F (36.7 C)   TempSrc: Oral Oral Oral   Resp:  16  19  Height:      Weight:      SpO2:  100%  100%    Intake/Output Summary (Last 24 hours) at 08/02/15 0715 Last data filed at 08/02/15 0200  Gross per 24 hour  Intake    200 ml  Output      0 ml  Net    200 ml   Filed Weights   07/29/15 1345  Weight: 50.8 kg (111 lb 15.9 oz)    Exam: Gen:  NAD Cardiovascular:  RRR.-JVD  Chest and lungs:   Good air movement CTA B/L Abdomen:  Abdomen soft, NT/ND, + BS Extremities:  No lower extremity edema.   Data Reviewed:    Labs: Basic Metabolic Panel:  Recent Labs Lab 07/29/15 0900 07/29/15 0923 07/30/15 0443 07/31/15 0750 08/01/15 0330  NA 134* 136 136 137 139  K 4.7 4.7 4.3 3.8 3.5  CL 107 103 112* 114* 114*  CO2 22  --  18* 17* 14*  GLUCOSE 72 66 45* 58* 38*  BUN 37* 36* 35* 25* 24*  CREATININE 1.26* 1.30* 1.18* 1.13* 1.03*  CALCIUM 7.9*  --  7.1* 6.8* 6.6*   GFR Estimated Creatinine Clearance: 27.9 mL/min (by C-G formula based on Cr of 1.03). Liver Function Tests:  Recent Labs Lab 07/29/15 0900  AST 36  ALT 32  ALKPHOS 101  BILITOT 0.5  PROT 5.0*  ALBUMIN 1.4*   No results for input(s): LIPASE, AMYLASE in the last 168 hours. No results for input(s): AMMONIA in the last 168  hours. Coagulation profile  Recent Labs Lab 07/29/15 0900  INR 1.68*    CBC:  Recent Labs Lab 07/29/15 0900  07/30/15 0900 07/30/15 1916 07/31/15 0700 07/31/15 1835 08/01/15 0723  WBC 10.5  < > 10.2 7.5 7.5 6.3 6.2  NEUTROABS 8.6*  --   --   --   --   --   --   HGB 10.3*  < > 9.4* 8.3* 7.6* 7.9* 8.1*  HCT 30.6*  < > 28.6* 24.1* 22.8* 23.7* 24.3*  MCV 95.9  < > 96.0 93.4 94.6 94.0 95.7  PLT 250  < > 239 176 187 190 200  < > = values in this interval not displayed. Cardiac Enzymes: No results for input(s): CKTOTAL, CKMB, CKMBINDEX, TROPONINI in the last 168 hours. BNP (last 3 results) No results for input(s): PROBNP in the last 8760 hours. CBG:  Recent Labs Lab 08/01/15 0519 08/01/15 0537  GLUCAP 31* 119*   D-Dimer: No results for input(s): DDIMER in the last 72 hours. Hgb A1c: No results for input(s): HGBA1C in the last 72 hours. Lipid Profile: No results for input(s): CHOL, HDL, LDLCALC, TRIG, CHOLHDL, LDLDIRECT in the last 72 hours. Thyroid function studies: No results for input(s): TSH, T4TOTAL, T3FREE, THYROIDAB in the last 72 hours.  Invalid input(s): FREET3 Anemia work up: No results for input(s): VITAMINB12, FOLATE, FERRITIN, TIBC, IRON, RETICCTPCT in the last 72 hours. Sepsis Labs:  Recent Labs Lab 07/29/15 0924  07/30/15 0900 07/30/15 1916 07/31/15 0700 07/31/15 1835 08/01/15 0723  WBC  --   < > 10.2 7.5 7.5 6.3 6.2  LATICACIDVEN 2.15*  --  1.1  --   --   --   --   < > = values in this interval not displayed. Microbiology Recent Results (from the past 240 hour(s))  Culture, blood (Routine X 2) w Reflex to ID Panel     Status: None (Preliminary result)   Collection Time: 07/29/15  8:30 AM  Result Value Ref Range Status   Specimen Description BLOOD LEFT ANTECUBITAL  Final   Special Requests BOTTLES DRAWN AEROBIC AND ANAEROBIC 5CC  Final   Culture NO GROWTH 3 DAYS  Final   Report Status PENDING  Incomplete  Culture, blood (Routine X 2) w  Reflex to ID Panel     Status: None (Preliminary result)   Collection Time: 07/29/15  9:00 AM  Result Value Ref Range Status   Specimen Description BLOOD LEFT ANTECUBITAL  Final   Special  Requests BOTTLES DRAWN AEROBIC AND ANAEROBIC 5CC  Final   Culture NO GROWTH 3 DAYS  Final   Report Status PENDING  Incomplete  MRSA PCR Screening     Status: None   Collection Time: 07/29/15  1:57 PM  Result Value Ref Range Status   MRSA by PCR NEGATIVE NEGATIVE Final    Comment:        The GeneXpert MRSA Assay (FDA approved for NASAL specimens only), is one component of a comprehensive MRSA colonization surveillance program. It is not intended to diagnose MRSA infection nor to guide or monitor treatment for MRSA infections.      Medications:   . metoprolol  50 mg Oral Daily  . pantoprazole (PROTONIX) IV  40 mg Intravenous Q12H  . QUEtiapine  25 mg Oral QHS  . sodium chloride  3 mL Intravenous Q12H   Continuous Infusions: . dextrose 5 % and 0.45% NaCl 50 mL/hr (08/01/15 1324)    Time spent: 15 min   LOS: 4 days   Charlynne Cousins  [facility] Pager [phone]  *Please refer to [email or web address], password [email or web address] to get updated schedule on who will round on this patient, as hospitalists switch teams weekly. If 7PM-7AM, please contact night-coverage at [email or web address], password [email or web address] for any overnight needs.  08/02/2015, 7:15 AM

## 2015-08-02 NOTE — Consult Note (Signed)
[facility] [employer] Received request from [place] for family interest in [facility]. Met with patient's son Ronalee Belts and daughter Jacqlyn Larsen to complete paper work for transfer to [facility], 08/03/2015. Dr.Donald Hertweck to assume care per family preference.   Please fax discharge summary to [phone].  RN please call report to [phone].  Thank you. Erling Conte, [place]

## 2015-08-02 NOTE — Progress Notes (Signed)
Family [facility]) notified of transfer to [phone]. All belongings sent with patient including glasses, dentures. BP low, Arsenio Loader RN 30M aware of comfort care measures. Pt to transfer by NT via bed. eICU and CCMD notified of transfer.

## 2015-08-02 NOTE — Progress Notes (Signed)
Physician notified: Olevia Bowens At: [place]  Regarding: FYI AM BP 74/49 (58), pt refusing care, meds, assessment. Awaiting return response.   Returned Response at: 0737  Order(s): Pt can tx to floor, artifact BP--orthostatic BP negative.

## 2015-08-03 LAB — CULTURE, BLOOD (ROUTINE X 2)
CULTURE: NO GROWTH
Culture: NO GROWTH

## 2015-08-03 MED ORDER — MORPHINE SULFATE (CONCENTRATE) 10 MG /0.5 ML PO SOLN: 20.0000 mg | ORAL | Status: AC | PRN

## 2015-08-03 MED ORDER — PANTOPRAZOLE SODIUM 40 MG PO TBEC
40.0000 mg | DELAYED_RELEASE_TABLET | Freq: Two times a day (BID) | ORAL | Status: DC
  Administered 2015-08-03: 40 mg via ORAL
  Filled 2015-08-03: qty 1

## 2015-08-03 MED ORDER — LORAZEPAM 2 MG/ML PO CONC: 2.0000 mg | Freq: Four times a day (QID) | ORAL | Status: AC | PRN

## 2015-08-03 NOTE — Progress Notes (Signed)
OT Cancellation Note  Patient Details Name: Lacey Morgan MRN: [id number] DOB: Aug 02, 1924   Cancelled Treatment:    Reason Eval/Treat Not Completed:  Pt now receiving comfort care with plans to discharge to [facility]. OT is signing off.  [name] 08/03/2015, 9:16 AM

## 2015-08-03 NOTE — Progress Notes (Signed)
Prescriptions given to [facility] transportation, IVs removed, neuro assessment unchanged. Patient denies pain, refused medication for transportation.

## 2015-08-03 NOTE — Discharge Summary (Signed)
Physician Discharge Summary  Lacey Morgan [id number] DOB: 27-Sep-1923 DOA: 07/29/2015  PCP: Lacey Pao, MD  Admit date: 07/29/2015 Discharge date: 08/03/2015  Time spent: 35 minutes  Recommendations for Outpatient Follow-up:  1. Will go to Lacey Morgan.   Discharge Diagnoses:  Active Problems:   Hemorrhagic shock   GI bleeding   AKI (acute kidney injury) ([place])   Acute blood loss anemia   Melena   Cameron lesion, acute   Hiatal hernia   Dyspnea   Encounter for palliative care   Goals of care, counseling/discussion   Discharge Condition:guarded  Diet recommendation: comfort feeds  Filed Weights   07/29/15 1345  Weight: 50.8 kg (111 lb 15.9 oz)    History of present illness:  81 y.o. female with PMH of HTN, Anemia, Recent DVT (on xarelto) presented with progressive generalized weakness, lightheadedness. Patient was unable to walk independently lately, so family brought her to ED. She also reports having black tarry stools for few weeks. Daughter believes that symptoms started after taking xarelto for DVT. Patient denies acute chest pains, no SOB, no fever, cough. She denies abdominal pains, no focal weakness but reports overall tiredness.   Hospital Course:  Hemorrhagic shock in the Setting of xarelto  use/GI bleed/acute blood loss anemia: To start on IV fluids aggressively was given 2 units of packed red blood status started on IV Protonix. GI was consulted who recommended an EGD thousand and 07/31/2015 show Lacey Morgan lesions It was discussed with the family that she needed colonoscopy but they refused. After several family meetings the family decided to move towards comfort care and no pursue any further aggressive workup. Palliative care was consulted and they discussed with the family the options the family opted for they can Morgan. She will go to Lacey Morgan Morgan with morphine and Ativan and comfort feeds.  Acute kidney injury: Lacey Morgan due to hypotension  resolved with IV hydration.  Recently diagnosed DVT: The patient was not a candidate for anticoagulation due to her GI bleed, a alto was stopped.  Mild dementia: Was treated with Seroquel at bedtime.  Procedures:  EGD  Consultations:  gastroenterology  Discharge Exam: Filed Vitals:   08/03/15 0610 08/03/15 0934  BP: 147/72 82/52  Pulse: 118 86  Temp: 97.7 F (36.5 C) 98 F (36.7 C)  Resp: 17 16    General: A&O x3 Cardiovascular: rrr Respiratory: good air movement CTA B/L  Discharge Instructions   Discharge Instructions    Diet - low sodium heart healthy    Complete by:  As directed      Increase activity slowly    Complete by:  As directed           Current Discharge Medication List    START taking these medications   Details  LORazepam (ATIVAN) 2 MG/ML concentrated solution Take 1 mL (2 mg total) by mouth every 6 (six) hours as needed for anxiety. Qty: 30 mL, Refills: 0    Morphine Sulfate (MORPHINE CONCENTRATE) 10 mg / 0.5 ml concentrated solution Take 1 mL (20 mg total) by mouth every 4 (four) hours as needed for severe pain. Qty: 30 mL, Refills: 0      STOP taking these medications     alendronate (FOSAMAX) 70 MG tablet      calcium carbonate (OSCAL) 1500 (600 CA) MG TABS tablet      ferrous sulfate 325 (65 FE) MG tablet      lisinopril (PRINIVIL,ZESTRIL) 10 MG tablet  metoprolol (LOPRESSOR) 100 MG tablet      rivaroxaban (XARELTO) 20 MG TABS tablet        No Known Allergies    The results of significant diagnostics from this hospitalization (including imaging, microbiology, ancillary and laboratory) are listed below for reference.    Significant Diagnostic Studies: Dg Chest Port 1 View  07/30/2015  CLINICAL DATA:  Dyspnea, history hypertension EXAM: PORTABLE CHEST 1 VIEW COMPARISON:  Portable exam 1952 hours compared to 07/29/2015 FINDINGS: Upper normal heart size. Mediastinal contours and pulmonary vascularity normal. Bibasilar  pleural effusions and atelectasis. Central peribronchial thickening. Upper lungs clear. No pneumothorax. Surgical clips RIGHT axilla. Bones demineralized. IMPRESSION: Bronchitic changes with bibasilar pleural effusions and atelectasis minimally increased since previous exam. Electronically Signed   By: Lavonia Dana M.D.   On: 07/30/2015 19:58   Dg Chest Port 1 View  07/29/2015  CLINICAL DATA:  Hypotension EXAM: PORTABLE CHEST 1 VIEW COMPARISON:  None. FINDINGS: There are small pleural effusions bilaterally. There is no edema or consolidation. Heart is upper normal in size with pulmonary vascularity within normal limits. No adenopathy. Bones are osteoporotic. There are surgical clips the right axillary region. Patient has had a previous mastectomy on the right. There old healed rib fractures on the left. IMPRESSION: Small pleural effusions bilaterally. Lungs otherwise clear. Heart upper normal in size. Bones osteoporotic. Electronically Signed   By: Lowella Grip III M.D.   On: 07/29/2015 08:55    Microbiology: Recent Results (from the past 240 hour(s))  Culture, blood (Routine X 2) w Reflex to ID Panel     Status: None (Preliminary result)   Collection Time: 07/29/15  8:30 AM  Result Value Ref Range Status   Specimen Description BLOOD LEFT ANTECUBITAL  Final   Special Requests BOTTLES DRAWN AEROBIC AND ANAEROBIC 5CC  Final   Culture NO GROWTH 4 DAYS  Final   Report Status PENDING  Incomplete  Culture, blood (Routine X 2) w Reflex to ID Panel     Status: None (Preliminary result)   Collection Time: 07/29/15  9:00 AM  Result Value Ref Range Status   Specimen Description BLOOD LEFT ANTECUBITAL  Final   Special Requests BOTTLES DRAWN AEROBIC AND ANAEROBIC 5CC  Final   Culture NO GROWTH 4 DAYS  Final   Report Status PENDING  Incomplete  MRSA PCR Screening     Status: None   Collection Time: 07/29/15  1:57 PM  Result Value Ref Range Status   MRSA by PCR NEGATIVE NEGATIVE Final    Comment:         The GeneXpert MRSA Assay (FDA approved for NASAL specimens only), is one component of a comprehensive MRSA colonization surveillance program. It is not intended to diagnose MRSA infection nor to guide or monitor treatment for MRSA infections.      Labs: Basic Metabolic Panel:  Recent Labs Lab 07/29/15 0900 07/29/15 0923 07/30/15 0443 07/31/15 0750 08/01/15 0330  NA 134* 136 136 137 139  K 4.7 4.7 4.3 3.8 3.5  CL 107 103 112* 114* 114*  CO2 22  --  18* 17* 14*  GLUCOSE 72 66 45* 58* 38*  BUN 37* 36* 35* 25* 24*  CREATININE 1.26* 1.30* 1.18* 1.13* 1.03*  CALCIUM 7.9*  --  7.1* 6.8* 6.6*   Liver Function Tests:  Recent Labs Lab 07/29/15 0900  AST 36  ALT 32  ALKPHOS 101  BILITOT 0.5  PROT 5.0*  ALBUMIN 1.4*   No results for input(s): LIPASE, AMYLASE  in the last 168 hours. No results for input(s): AMMONIA in the last 168 hours. CBC:  Recent Labs Lab 07/29/15 0900  07/30/15 0900 07/30/15 1916 07/31/15 0700 07/31/15 1835 08/01/15 0723  WBC 10.5  < > 10.2 7.5 7.5 6.3 6.2  NEUTROABS 8.6*  --   --   --   --   --   --   HGB 10.3*  < > 9.4* 8.3* 7.6* 7.9* 8.1*  HCT 30.6*  < > 28.6* 24.1* 22.8* 23.7* 24.3*  MCV 95.9  < > 96.0 93.4 94.6 94.0 95.7  PLT 250  < > 239 176 187 190 200  < > = values in this interval not displayed. Cardiac Enzymes: No results for input(s): CKTOTAL, CKMB, CKMBINDEX, TROPONINI in the last 168 hours. BNP: BNP (last 3 results)  Recent Labs  06/04/15 1751 07/29/15 1254  BNP 210.0* 339.9*    ProBNP (last 3 results) No results for input(s): PROBNP in the last 8760 hours.  CBG:  Recent Labs Lab 08/01/15 0519 08/01/15 0537 08/02/15 1652  GLUCAP 31* 119* 139*       Signed:  Charlynne Cousins  [facility] 08/03/2015, 9:52 AM

## 2015-08-03 NOTE — Progress Notes (Signed)
Patient ready for discharge. Report given to [facility]. Will continue to monitor until transfer to [facility].

## 2015-08-03 NOTE — Clinical Social Work Note (Signed)
Clinical Social Worker facilitated patient discharge including contacting patient family and facility to confirm patient discharge plans.  Clinical information faxed to facility and family agreeable with plan.  CSW arranged ambulance transport [facility].  RN to call report prior to discharge.  Clinical Social Worker will sign off for now as social work intervention is no longer needed. Please consult [place] again if new need arises.  Glendon Axe, MSW, LCSWA [phone] 08/03/2015 10:24 AM

## 2015-08-14 DEATH — deceased

## 2017-10-31 IMAGING — US US EXTREM LOW VENOUS*R*
1 series · 13 of 24 positions shown · non-contrast
Comparison: None.

CLINICAL DATA: 89-year-old female with right lower extremity
redness and swelling for the past 3 weeks



[Series 1: us extrem low venous*right* · 0.09mm/px · 13 of 48 slices shown]
[im 1/48]
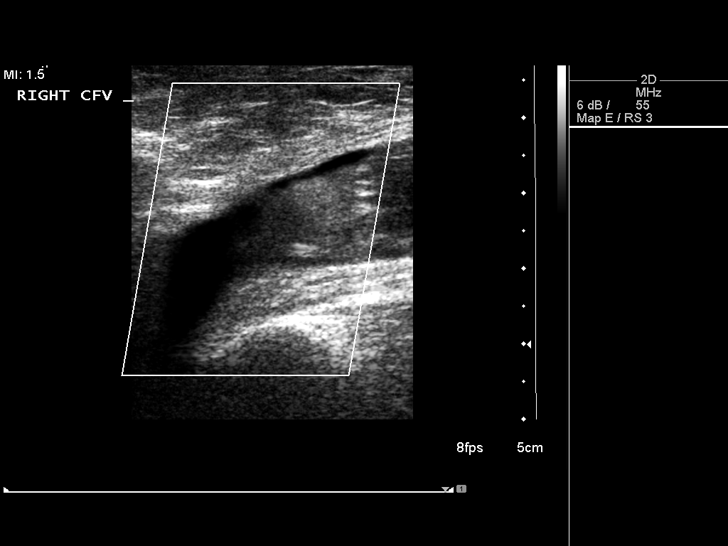
[im 5/48]
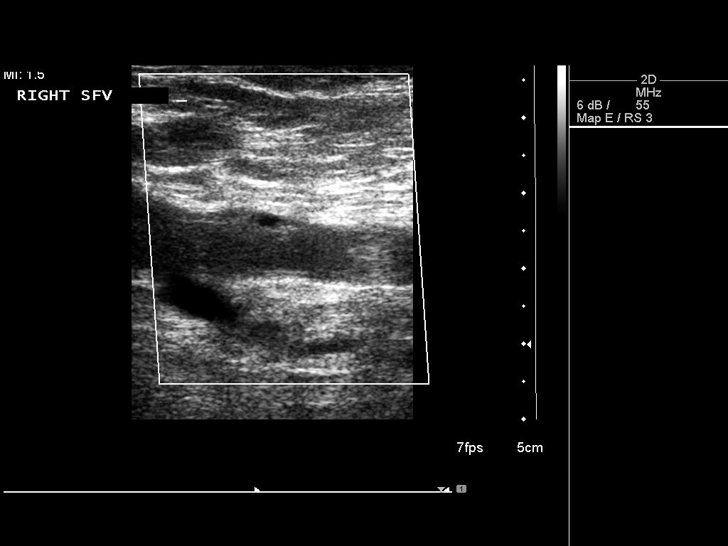
[im 9/48]
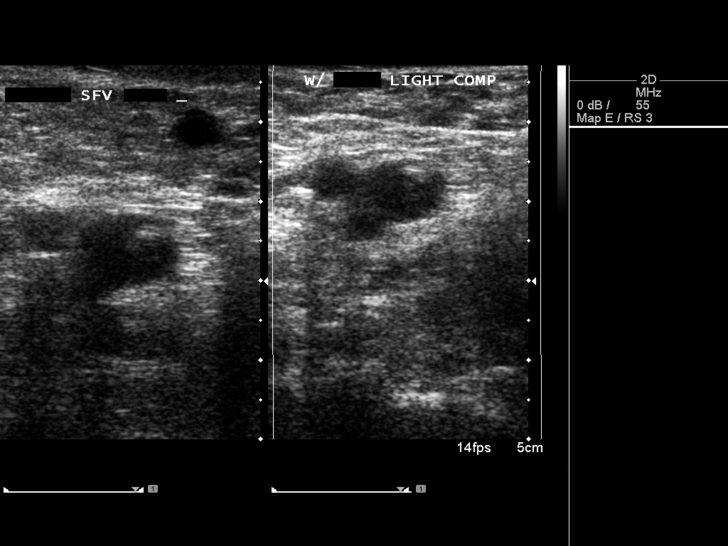
[im 13/48]
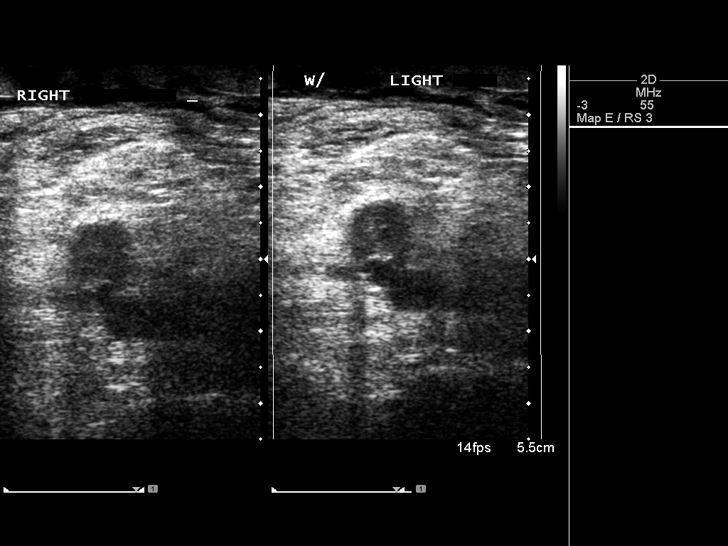
[im 17/48]
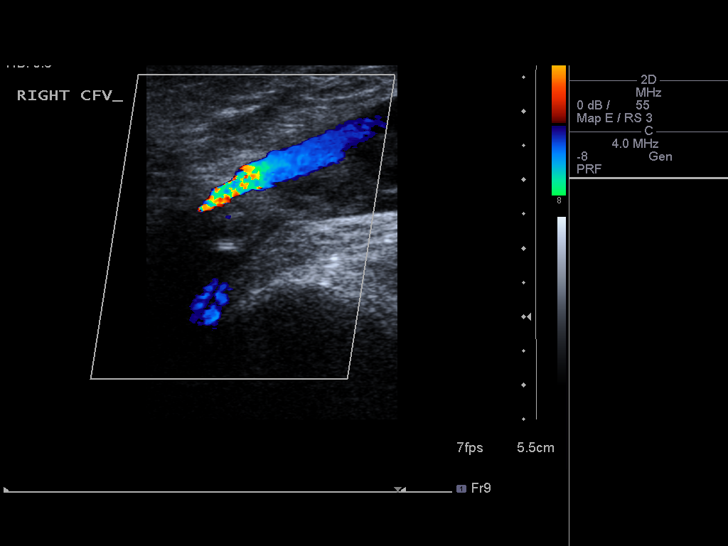
[im 21/48]
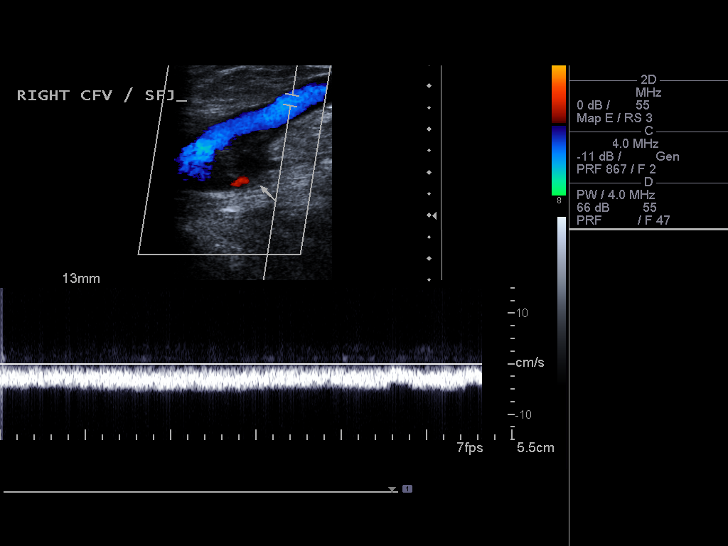
[im 25/48]
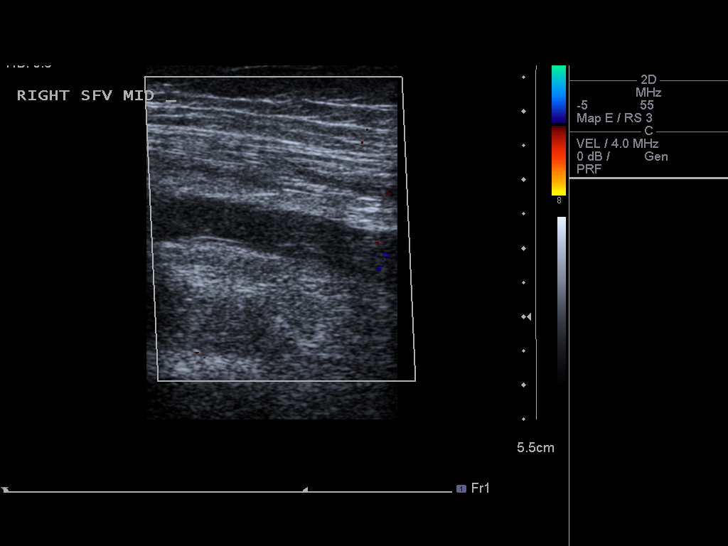
[im 27/48]
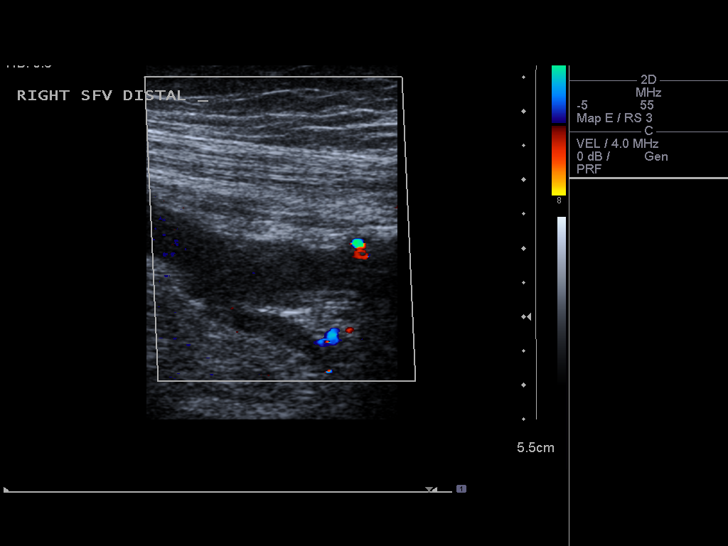
[im 31/48]
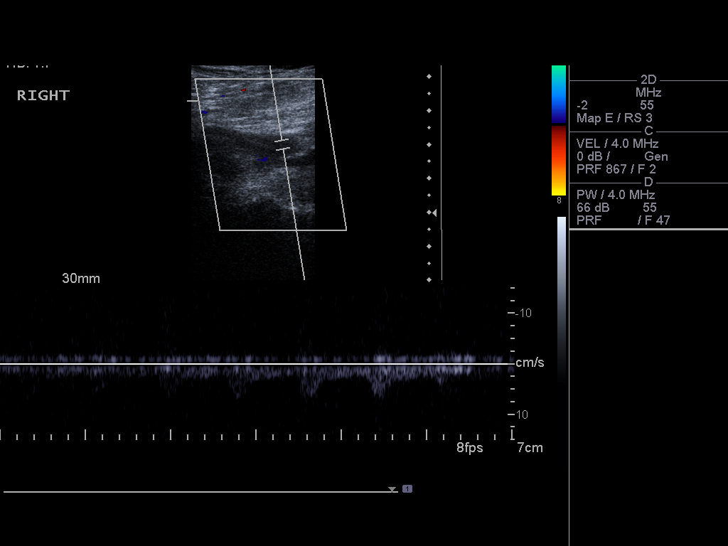
[im 35/48]
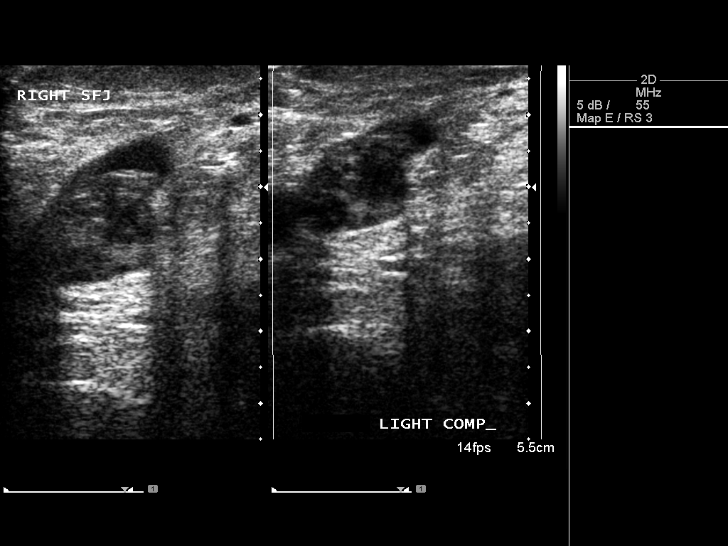
[im 39/48]
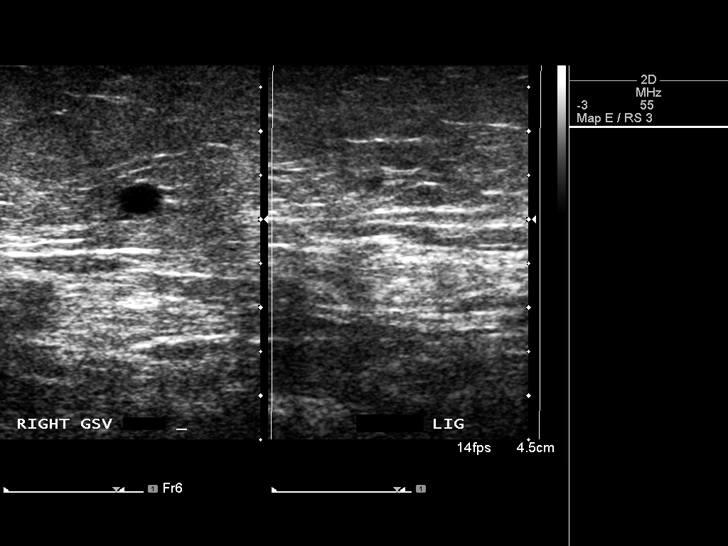
[im 43/48]
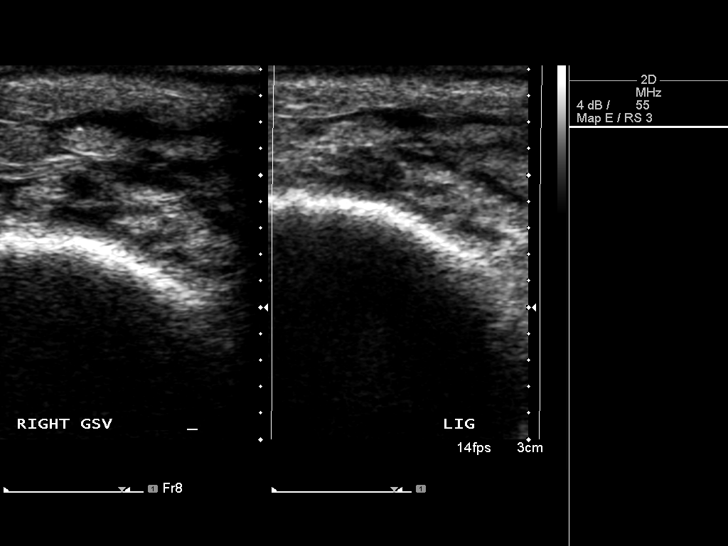
[im 48/48]
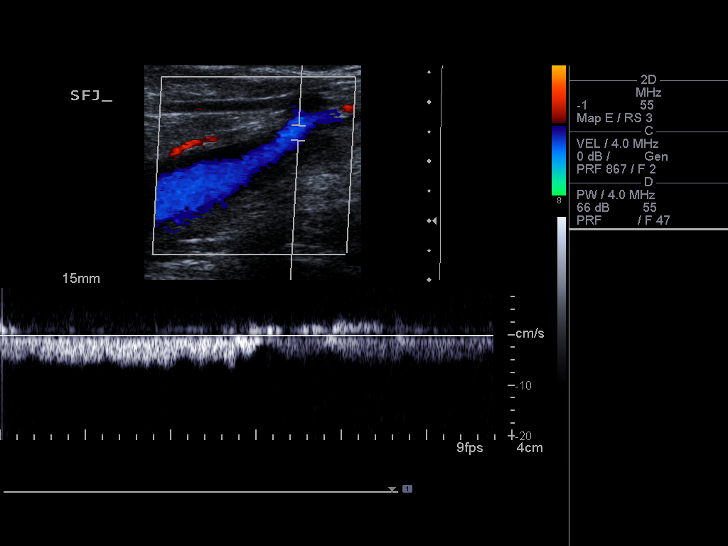

[13 of 24 positions shown; findings below may reference images not displayed]

FINDINGS: Contralateral Common Femoral Vein: Respiratory phasicity is normal
and symmetric with the symptomatic side. No evidence of thrombus.
Normal compressibility.

Common Femoral Vein: Positive for nonocclusive thrombus within the
common femoral vein. There are intermediate attenuation echoes
consistent with thrombus partially filling the vessel. The vessel is
minimally compressible.

Saphenofemoral Junction: Thrombus extends just into the saphenous
femoral junction. The great saphenous vein remains patent.

Profunda Femoral Vein: No evidence of thrombus. Normal
compressibility, respiratory phasicity .

Femoral Vein: Thrombus extends throughout the femoral vein in the
thigh

Popliteal Vein: Thrombus extends into the popliteal vein.

Calf Veins: Thrombus extends into the proximal calf veins.

Superficial Great Saphenous Vein: No evidence of thrombus. Normal
compressibility and flow on color Doppler imaging.

Venous Reflux:  None.

Other Findings:  None.
IMPRESSION: Positive for extensive acute to subacute DVT in the right lower
extremity extending from the calf up to the common femoral vein at
the hip.

These results were called by telephone at the time of interpretation
on 06/21/2015 at [DATE] to Dr. JONALDI SAMUELI , who verbally
acknowledged these results.

## 2017-12-08 IMAGING — DX DG CHEST 1V PORT
1 series · 1 of 1 positions shown · non-contrast
Comparison: None.

CLINICAL DATA: Hypotension

EXAM:
PORTABLE CHEST 1 VIEW

[chest ap]
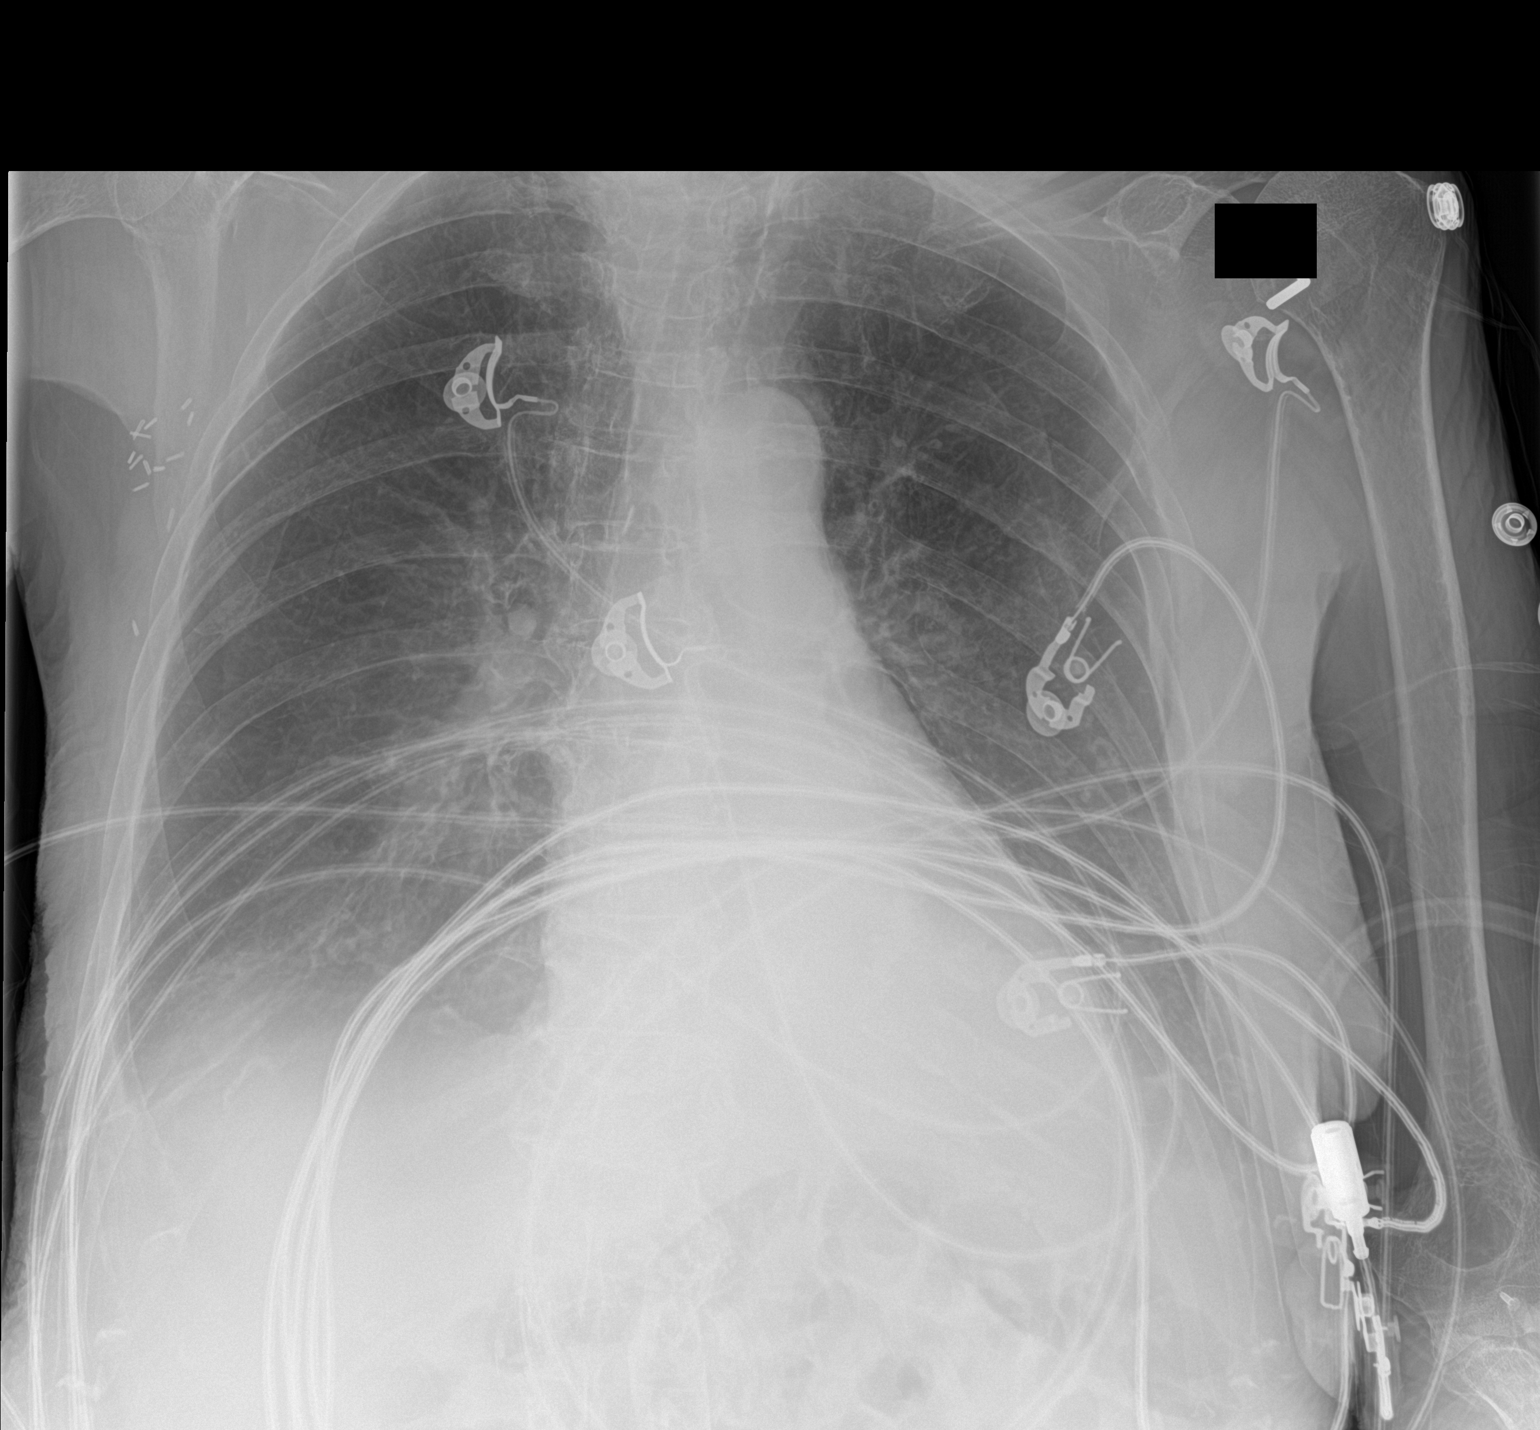

[1 of 1 positions shown; findings below may reference images not displayed]

FINDINGS: There are small pleural effusions bilaterally. There is no edema or
consolidation. Heart is upper normal in size with pulmonary
vascularity within normal limits. No adenopathy. Bones are
osteoporotic. There are surgical clips the right axillary region.
Patient has had a previous mastectomy on the right. There old healed
rib fractures on the left.
IMPRESSION: Small pleural effusions bilaterally. Lungs otherwise clear. Heart
upper normal in size. Bones osteoporotic.
